# Patient Record
Sex: Male | Born: 2000 | Race: Black or African American | Hispanic: No | Marital: Single | Smoking: Never smoker
Health system: Southern US, Community
[De-identification: ages and names within clinical notes are randomized; demographics above are authoritative.]

## PROBLEM LIST (undated history)

## (undated) DIAGNOSIS — Z789 Other specified health status: Secondary | ICD-10-CM

## (undated) DIAGNOSIS — F419 Anxiety disorder, unspecified: Secondary | ICD-10-CM

---
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
## 2020-04-22 ENCOUNTER — Ambulatory Visit: Payer: Self-pay | Admitting: Nurse Practitioner

## 2020-12-11 ENCOUNTER — Emergency Department (HOSPITAL_COMMUNITY): Payer: Managed Care, Other (non HMO)

## 2020-12-11 ENCOUNTER — Emergency Department (HOSPITAL_COMMUNITY)
Admission: EM | Admit: 2020-12-11 | Discharge: 2020-12-11 | Disposition: A | Discharge status: Home / Self Care | Payer: Managed Care, Other (non HMO) | Attending: Emergency Medicine | Admitting: Emergency Medicine

## 2020-12-11 ENCOUNTER — Other Ambulatory Visit: Payer: Self-pay

## 2020-12-11 ENCOUNTER — Encounter (HOSPITAL_COMMUNITY): Payer: Self-pay | Admitting: Emergency Medicine

## 2020-12-11 DIAGNOSIS — R609 Edema, unspecified: Secondary | ICD-10-CM

## 2020-12-11 DIAGNOSIS — S93491A Sprain of other ligament of right ankle, initial encounter: Secondary | ICD-10-CM

## 2020-12-11 DIAGNOSIS — X58XXXA Exposure to other specified factors, initial encounter: Secondary | ICD-10-CM | POA: Insufficient documentation

## 2020-12-11 DIAGNOSIS — S93431A Sprain of tibiofibular ligament of right ankle, initial encounter: Secondary | ICD-10-CM | POA: Diagnosis not present

## 2020-12-11 DIAGNOSIS — Y9367 Activity, basketball: Secondary | ICD-10-CM | POA: Insufficient documentation

## 2020-12-11 DIAGNOSIS — S99911A Unspecified injury of right ankle, initial encounter: Secondary | ICD-10-CM | POA: Diagnosis present

## 2020-12-11 MED ORDER — IBUPROFEN 600 MG PO TABS: 600.0000 mg | ORAL_TABLET | Freq: Four times a day (QID) | ORAL | 0 refills | Status: DC | PRN

## 2020-12-11 MED ORDER — CYCLOBENZAPRINE HCL 10 MG PO TABS: 10.0000 mg | ORAL_TABLET | Freq: Two times a day (BID) | ORAL | 0 refills | Status: DC | PRN

## 2020-12-11 NOTE — ED Provider Notes (Signed)
[facility]-EMERGENCY DEPT Provider Note   CSN: [id number] Arrival date & time: 12/11/20  1531     History Chief Complaint  Patient presents with  . Ankle Pain    Kenneth Farley is a 20 y.o. male.  The history is provided by the patient. No language interpreter was used.  Ankle Pain Associated symptoms: no fever      20 year old male presents for evaluation of ankle injury.  Patient reports 2 days ago while playing basketball he accidentally stepped on someone else's foot and rolled his right ankle.  Reported cute onset of sharp throbbing pain to the ankle and he has noticed increasing pain and swelling since.  Pain is mild to moderate worse with movement and improved with ice.  Endorsed increasing pain when he tried to walk on it.  He has been able to bear some weight on it.  Does not complain of any knee or hip pain and no foot pain.  He has injured his right ankle in the past.  No report of any numbness.  History reviewed. No pertinent past medical history.  There are no problems to display for this patient.   History reviewed. No pertinent surgical history.     No family history on file.  Social History   Tobacco Use  . Smoking status: Never Smoker  Substance Use Topics  . Alcohol use: No  . Drug use: No    Home Medications Prior to Admission medications   Not on File    Allergies    Patient has no known allergies.  Review of Systems   Review of Systems  Constitutional: Negative for fever.  Musculoskeletal: Positive for arthralgias and joint swelling.  Skin: Negative for wound.  Neurological: Negative for numbness.    Physical Exam Updated Vital Signs BP (!) 147/79 (BP Location: Right Arm)   Pulse 60   Temp 98.8 F (37.1 C) (Oral)   Resp 18   Ht 6\' 2"  (1.88 m)   Wt 72.6 kg   SpO2 98%   BMI 20.54 kg/m   Physical Exam Vitals and nursing note reviewed.  Constitutional:      General: He is not in acute distress.     Appearance: He is well-developed and well-nourished.  HENT:     Head: Atraumatic.  Eyes:     Conjunctiva/sclera: Conjunctivae normal.  Musculoskeletal:        General: Tenderness (Right ankle: Moderate tenderness to lateral malleolus and medial malleoli region with associated edema but no obvious crepitus or obvious deformity noted.  Decreased range of motion secondary to pain.  Intact DP pulse with brisk cap refill.) present.     Cervical back: Neck supple.     Comments: Right leg: No tenderness to right knee.  No tenderness to fifth metatarsal region.  Skin:    Findings: No rash.  Neurological:     Mental Status: He is alert.  Psychiatric:        Mood and Affect: Mood and affect normal.     ED Results / Procedures / Treatments   Labs (all labs ordered are listed, but only abnormal results are displayed) Labs Reviewed - No data to display  EKG None  Radiology DG Ankle Complete Right  Result Date: 12/11/2020 CLINICAL DATA:  Injury. Twisted right ankle 2 days ago while playing basketball EXAM: RIGHT ANKLE - COMPLETE 3+ VIEW COMPARISON:  05/07/2005 FINDINGS: There is a diffuse soft tissue swelling. No underlying fracture or dislocation. No significant arthropathy identified.  IMPRESSION: 1. Soft tissue swelling. 2. No acute bone abnormality. Electronically Signed   By: Signa Kell M.D.   On: 12/11/2020 16:44    Procedures Procedures   Medications Ordered in ED Medications - No data to display  ED Course  I have reviewed the triage vital signs and the nursing notes.  Pertinent labs & imaging results that were available during my care of the patient were reviewed by me and considered in my medical decision making (see chart for details).    MDM Rules/Calculators/A&P                          BP (!) 147/79 (BP Location: Right Arm)   Pulse 60   Temp 98.8 F (37.1 C) (Oral)   Resp 18   Ht 6\' 2"  (1.88 m)   Wt 72.6 kg   SpO2 98%   BMI 20.54 kg/m   Final Clinical  Impression(s) / ED Diagnoses Final diagnoses:  Swelling  Sprain of anterior talofibular ligament of right ankle, initial encounter    Rx / DC Orders ED Discharge Orders    None     4:22 PM Patient twisted his right ankle while playing basketball several days prior.  He does have moderate edema noted to his right ankle most significant to the lateral malleolus region.  X-rays ordered.  4:49 PM X-ray of the right ankle demonstrate soft tissue swelling without any acute bone abnormalities.  Finding consistent with an ankle sprain.  Will place on an ASO brace for support.  Rice therapy discussed.  Stable for discharge.  Orthopedic referral given as needed.   , PA-C 12/11/20 1658    [place], 02/08/21, DO 12/11/20 [phone]

## 2020-12-11 NOTE — ED Notes (Signed)
Pt left before given d/c instructions

## 2020-12-11 NOTE — ED Triage Notes (Signed)
Patient injured his ankle on Monday playing basketball. R ankle is sollen, pain w/ ambulation.

## 2020-12-11 NOTE — Progress Notes (Signed)
Orthopedic Tech Progress Note Patient Details:  Kenneth Farley 02-Feb-2001 [id number]  Ortho Devices Type of Ortho Device: Ankle splint Ortho Device/Splint Location: right Ortho Device/Splint Interventions: Application   Post Interventions Patient Tolerated: Well Instructions Provided: Care of device   [name] 12/11/2020, 4:58 PM

## 2021-03-07 ENCOUNTER — Other Ambulatory Visit: Payer: Self-pay

## 2021-03-07 ENCOUNTER — Ambulatory Visit (HOSPITAL_COMMUNITY)
Admission: EM | Admit: 2021-03-07 | Discharge: 2021-03-07 | Disposition: A | Discharge status: Home / Self Care | Payer: Managed Care, Other (non HMO) | Attending: Internal Medicine | Admitting: Internal Medicine

## 2021-03-07 ENCOUNTER — Emergency Department (HOSPITAL_COMMUNITY)
Admission: EM | Admit: 2021-03-07 | Discharge: 2021-03-07 | Disposition: A | Discharge status: Home / Self Care | Payer: Self-pay | Attending: Emergency Medicine | Admitting: Emergency Medicine

## 2021-03-07 ENCOUNTER — Emergency Department (HOSPITAL_COMMUNITY): Payer: Self-pay

## 2021-03-07 ENCOUNTER — Encounter (HOSPITAL_COMMUNITY): Payer: Self-pay

## 2021-03-07 DIAGNOSIS — N200 Calculus of kidney: Secondary | ICD-10-CM | POA: Insufficient documentation

## 2021-03-07 LAB — URINALYSIS, ROUTINE W REFLEX MICROSCOPIC
Bacteria, UA: NONE SEEN
Bilirubin Urine: NEGATIVE
Glucose, UA: NEGATIVE mg/dL
Ketones, ur: 5 mg/dL — AB
Leukocytes,Ua: NEGATIVE
Nitrite: NEGATIVE
Protein, ur: 30 mg/dL — AB
RBC / HPF: >50 RBC/hpf — ABNORMAL HIGH (ref 0–5)
Specific Gravity, Urine: 1.016 (ref 1.005–1.030)
pH: 6 (ref 5.0–8.0)

## 2021-03-07 LAB — CBC WITH DIFFERENTIAL/PLATELET
Abs Immature Granulocytes: 0.02 10*3/uL (ref 0.00–0.07)
Basophils Absolute: 0 10*3/uL (ref 0.0–0.1)
Basophils Relative: 0 %
Eosinophils Absolute: 0 10*3/uL (ref 0.0–0.5)
Eosinophils Relative: 0 %
HCT: 49.5 % (ref 39.0–52.0)
Hemoglobin: 16.6 g/dL (ref 13.0–17.0)
Immature Granulocytes: 0 %
Lymphocytes Relative: 8 %
Lymphs Abs: 0.9 10*3/uL (ref 0.7–4.0)
MCH: 29 pg (ref 26.0–34.0)
MCHC: 33.5 g/dL (ref 30.0–36.0)
MCV: 86.5 fL (ref 80.0–100.0)
Monocytes Absolute: 0.6 10*3/uL (ref 0.1–1.0)
Monocytes Relative: 6 %
Neutro Abs: 8.9 10*3/uL — ABNORMAL HIGH (ref 1.7–7.7)
Neutrophils Relative %: 86 %
Platelets: 232 10*3/uL (ref 150–400)
RBC: 5.72 MIL/uL (ref 4.22–5.81)
RDW: 12.2 % (ref 11.5–15.5)
WBC: 10.4 10*3/uL (ref 4.0–10.5)
nRBC: 0 % (ref 0.0–0.2)

## 2021-03-07 LAB — COMPREHENSIVE METABOLIC PANEL
ALT: 25 U/L (ref 0–44)
AST: 30 U/L (ref 15–41)
Albumin: 4.6 g/dL (ref 3.5–5.0)
Alkaline Phosphatase: 103 U/L (ref 38–126)
Anion gap: 10 (ref 5–15)
BUN: 14 mg/dL (ref 6–20)
CO2: 27 mmol/L (ref 22–32)
Calcium: 9.3 mg/dL (ref 8.9–10.3)
Chloride: 101 mmol/L (ref 98–111)
Creatinine, Ser: 1.19 mg/dL (ref 0.61–1.24)
GFR, Estimated: >60 mL/min (ref >60)
Glucose, Bld: 109 mg/dL — ABNORMAL HIGH (ref 70–99)
Potassium: 4.1 mmol/L (ref 3.5–5.1)
Sodium: 138 mmol/L (ref 135–145)
Total Bilirubin: 0.6 mg/dL (ref 0.3–1.2)
Total Protein: 7.9 g/dL (ref 6.5–8.1)

## 2021-03-07 LAB — LIPASE, BLOOD: Lipase: 28 U/L (ref 11–51)

## 2021-03-07 MED ORDER — KETOROLAC TROMETHAMINE 30 MG/ML IJ SOLN
30.0000 mg | Freq: Once | INTRAMUSCULAR | Status: AC
  Administered 2021-03-07: 30 mg via INTRAVENOUS
  Filled 2021-03-07: qty 1

## 2021-03-07 MED ORDER — IOHEXOL 300 MG/ML  SOLN
100.0000 mL | Freq: Once | INTRAMUSCULAR | Status: AC | PRN
  Administered 2021-03-07: 100 mL via INTRAVENOUS

## 2021-03-07 MED ORDER — MORPHINE SULFATE (PF) 4 MG/ML IV SOLN
4.0000 mg | Freq: Once | INTRAVENOUS | Status: AC
Start: 2021-03-07 — End: 2021-03-07
  Administered 2021-03-07: 4 mg via INTRAVENOUS
  Filled 2021-03-07: qty 1

## 2021-03-07 MED ORDER — KETOROLAC TROMETHAMINE 10 MG PO TABS: 10.0000 mg | ORAL_TABLET | Freq: Three times a day (TID) | ORAL | 0 refills | Status: DC | PRN

## 2021-03-07 MED ORDER — ONDANSETRON HCL 4 MG/2ML IJ SOLN
4.0000 mg | Freq: Once | INTRAMUSCULAR | Status: AC
  Administered 2021-03-07: 4 mg via INTRAVENOUS
  Filled 2021-03-07: qty 2

## 2021-03-07 NOTE — ED Triage Notes (Signed)
Pt c/o abdominal pain that started this am. Pt denies nausea, vomiting, diarrhea, urinary difficulties, or discharge. Pt states he has constipation, last BM yesterday. Pt seen at [facility] and sent here for eval.

## 2021-03-07 NOTE — ED Notes (Signed)
Patient is being discharged from the Urgent Care and sent to the Emergency Department via POV . Per Lamphtey, MD, patient is in need of higher level of care due to abdominal pain. Patient is aware and verbalizes understanding of plan of care.  Vitals:   03/07/21 1419  BP: 135/87  Pulse: 76  Resp: 16  Temp: (!) 97.4 F (36.3 C)  SpO2: 100%

## 2021-03-07 NOTE — ED Provider Notes (Signed)
[facility] EMERGENCY DEPARTMENT Provider Note   CSN: [id number] Arrival date & time: 03/07/21  1430     History Chief Complaint  Patient presents with  . Abdominal Pain    Kenneth Farley is a 20 y.o. male.  Patient presents with mid abdominal pain.  Describes as persistent since this morning.  Describes it as sharp and nonradiating otherwise.  Denies fevers or cough.  No vomiting or diarrhea no prior similar pain.          History reviewed. No pertinent past medical history.  There are no problems to display for this patient.   History reviewed. No pertinent surgical history.     History reviewed. No pertinent family history.  Social History   Tobacco Use  . Smoking status: Never Smoker  . Smokeless tobacco: Never Used  Vaping Use  . Vaping Use: Never used  Substance Use Topics  . Alcohol use: No  . Drug use: No    Home Medications Prior to Admission medications   Medication Sig Start Date End Date Taking? Authorizing Provider  ketorolac (TORADOL) 10 MG tablet Take 1 tablet (10 mg total) by mouth every 8 (eight) hours as needed for up to 10 doses. 03/07/21  Yes Rilan Eiland, Eustace Moore, MD  cyclobenzaprine (FLEXERIL) 10 MG tablet Take 1 tablet (10 mg total) by mouth 2 (two) times daily as needed for muscle spasms. Patient not taking: Reported on 03/07/2021 12/11/20   Fayrene Helper, PA-C    Allergies    Patient has no known allergies.  Review of Systems   Review of Systems  Constitutional: Negative for fever.  HENT: Negative for ear pain and sore throat.   Eyes: Negative for pain.  Respiratory: Negative for cough.   Cardiovascular: Negative for chest pain.  Gastrointestinal: Positive for abdominal pain.  Genitourinary: Negative for flank pain.  Musculoskeletal: Negative for back pain.  Skin: Negative for color change and rash.  Neurological: Negative for syncope.  All other systems reviewed and are negative.   Physical Exam Updated Vital  Signs BP 137/88 (BP Location: Left Arm)   Pulse 72   Temp 98.7 F (37.1 C) (Oral)   Resp 15   SpO2 99%   Physical Exam Constitutional:      General: He is not in acute distress.    Appearance: He is well-developed.  HENT:     Head: Normocephalic.     Nose: Nose normal.  Eyes:     Extraocular Movements: Extraocular movements intact.  Cardiovascular:     Rate and Rhythm: Normal rate.  Pulmonary:     Effort: Pulmonary effort is normal.  Skin:    Coloration: Skin is not jaundiced.  Neurological:     Mental Status: He is alert. Mental status is at baseline.     ED Results / Procedures / Treatments   Labs (all labs ordered are listed, but only abnormal results are displayed) Labs Reviewed  COMPREHENSIVE METABOLIC PANEL - Abnormal; Notable for the following components:      Result Value   Glucose, Bld 109 (*)    All other components within normal limits  CBC WITH DIFFERENTIAL/PLATELET - Abnormal; Notable for the following components:   Neutro Abs 8.9 (*)    All other components within normal limits  URINALYSIS, ROUTINE W REFLEX MICROSCOPIC - Abnormal; Notable for the following components:   Hgb urine dipstick LARGE (*)    Ketones, ur 5 (*)    Protein, ur 30 (*)    RBC /  HPF >50 (*)    All other components within normal limits  LIPASE, BLOOD    EKG None  Radiology CT Abdomen Pelvis W Contrast  Result Date: 03/07/2021 CLINICAL DATA:  Nonlocalized abdominal pain. EXAM: CT ABDOMEN AND PELVIS WITH CONTRAST TECHNIQUE: Multidetector CT imaging of the abdomen and pelvis was performed using the standard protocol following bolus administration of intravenous contrast. CONTRAST:  OMNIPAQUE IOHEXOL 300 MG/ML  SOLN COMPARISON:  None. FINDINGS: Lower chest: No acute abnormality. Hepatobiliary: No focal liver abnormality. No gallstones, gallbladder wall thickening, or pericholecystic fluid. No biliary dilatation. Pancreas: No focal lesion. Normal pancreatic contour. No  surrounding inflammatory changes. No main pancreatic ductal dilatation. Spleen: Normal in size without focal abnormality. Adrenals/Urinary Tract: No adrenal nodule bilaterally. Delayed right nephrogram. Prominence of the right collecting system with no frank hydronephrosis. Question 2 mm stone in the region of the right ureterovesicular junction (3:71). No left hydronephrosis. No hydroureter. The urinary bladder is unremarkable. Stomach/Bowel: Stomach is within normal limits. No evidence of bowel wall thickening or dilatation. Question internal hernia involving the proximal small bowel (6:32-43) with no associated bowel obstruction. Appendix appears normal. Vascular/Lymphatic: No significant vascular findings are present. No enlarged abdominal or pelvic lymph nodes. Reproductive: Prostate is unremarkable. Other: No intraperitoneal free fluid. No intraperitoneal free gas. No organized fluid collection. Musculoskeletal: No abdominal wall hernia or abnormality. No suspicious lytic or blastic osseous lesions. No acute displaced fracture. Multilevel degenerative changes of the spine. IMPRESSION: 1. Delayed right nephrogram with prominence of the right collecting system with no frank hydronephrosis. Associated 2 mm stone in the region of the right ureterovesicular junction. 2. Normal appendix. Electronically Signed   By: Tish Frederickson M.D.   On: 03/07/2021 18:53    Procedures Procedures   Medications Ordered in ED Medications  ketorolac (TORADOL) 30 MG/ML injection 30 mg (has no administration in time range)  morphine 4 MG/ML injection 4 mg (4 mg Intravenous Given 03/07/21 1747)  ondansetron (ZOFRAN) injection 4 mg (4 mg Intravenous Given 03/07/21 1747)  iohexol (OMNIPAQUE) 300 MG/ML solution 100 mL (100 mLs Intravenous Contrast Given 03/07/21 1818)    ED Course  I have reviewed the triage vital signs and the nursing notes.  Pertinent labs & imaging results that were available during my care of the patient  were reviewed by me and considered in my medical decision making (see chart for details).    MDM Rules/Calculators/A&P                          Abdominal exam is benign no significant tenderness or guarding noted.  Labs unremarkable, CT abdomen pelvis shows right-sided kidney stone approximately 2 mm in size.  On repeat evaluation patient appears improved.  Recommending follow-up with outpatient urology.  Given instructions to follow-up with urology within the week, advised immediate return for worsening pain fevers or any additional concerns.  Final Clinical Impression(s) / ED Diagnoses Final diagnoses:  Kidney stone    Rx / DC Orders ED Discharge Orders         Ordered    ketorolac (TORADOL) 10 MG tablet  Every 8 hours PRN        03/07/21 2000           Cheryll Cockayne, MD 03/07/21 2000

## 2021-03-07 NOTE — ED Triage Notes (Signed)
Emergency Medicine Provider Triage Evaluation Note  Kenneth Farley , a 20 y.o. male  was evaluated in triage.  Pt complains of abd pain that started this am. Pain is diffuse. Denies nvd, urinary sxs.  Review of Systems  Positive: abd pain Negative: Nvd, urinary sxs  Physical Exam  BP 131/90 (BP Location: Right Arm)   Pulse (!) 58   Temp 97.6 F (36.4 C) (Oral)   Resp 18   SpO2 99%  Gen:   Awake, no distress   HEENT:  Atraumatic  Resp:  Normal effort  Cardiac:  Normal rate  Abd:   Nondistended, nontender  MSK:   Moves extremities without difficulty  Neuro:  Speech clear   Medical Decision Making  Medically screening exam initiated at 3:11 PM.  Appropriate orders placed.  Kenneth Farley was informed that the remainder of the evaluation will be completed by another provider, this initial triage assessment does not replace that evaluation, and the importance of remaining in the ED until their evaluation is complete.  Clinical Impression   MSE was initiated and I personally evaluated the patient and placed orders (if any) at  3:12 PM on March 07, 2021.  The patient appears stable so that the remainder of the MSE may be completed by another provider.    Karrie Meres, PA-C 03/07/21 1512

## 2021-03-07 NOTE — ED Triage Notes (Signed)
Patient presents to Urgent Care with complaints of mid abdominal pain since this morning. Pt states pain starts mid abdominal region and spreads to lower back. Pt states he is concerned that it may be constipation related. No changes in diet or meds.   Denies fever, n/v, or diarrhea.

## 2021-03-07 NOTE — Discharge Instructions (Signed)
Call your primary care doctor or specialist as discussed in the next 2-3 days.   Return immediately back to the ER if:  Your symptoms worsen within the next 12-24 hours. You develop new symptoms such as new fevers, persistent vomiting, new pain, shortness of breath, or new weakness or numbness, or if you have any other concerns.  

## 2021-05-23 ENCOUNTER — Emergency Department (HOSPITAL_COMMUNITY)
Admission: EM | Admit: 2021-05-23 | Discharge: 2021-05-23 | Discharge status: Against Medical Advice | Payer: Medicaid Other

## 2021-05-23 NOTE — ED Notes (Signed)
Attempted to call for triage with no respond. 

## 2022-06-07 ENCOUNTER — Other Ambulatory Visit: Payer: Self-pay

## 2022-06-07 ENCOUNTER — Encounter (HOSPITAL_COMMUNITY): Admission: EM | Disposition: A | Discharge status: Home / Self Care | Payer: Self-pay | Source: Home / Self Care

## 2022-06-07 ENCOUNTER — Emergency Department (HOSPITAL_COMMUNITY): Payer: 59 | Admitting: Certified Registered Nurse Anesthetist

## 2022-06-07 ENCOUNTER — Emergency Department (EMERGENCY_DEPARTMENT_HOSPITAL): Payer: 59 | Admitting: Certified Registered Nurse Anesthetist

## 2022-06-07 ENCOUNTER — Inpatient Hospital Stay (HOSPITAL_COMMUNITY)
Admission: EM | Admit: 2022-06-07 | Discharge: 2022-06-14 | DRG: 341 | Disposition: A | Discharge status: Home / Self Care | Payer: 59 | Attending: General Surgery | Admitting: General Surgery

## 2022-06-07 ENCOUNTER — Emergency Department (HOSPITAL_COMMUNITY): Payer: 59

## 2022-06-07 ENCOUNTER — Encounter (HOSPITAL_COMMUNITY): Payer: Self-pay | Admitting: Emergency Medicine

## 2022-06-07 DIAGNOSIS — K559 Vascular disorder of intestine, unspecified: Secondary | ICD-10-CM | POA: Diagnosis present

## 2022-06-07 DIAGNOSIS — Z87442 Personal history of urinary calculi: Secondary | ICD-10-CM

## 2022-06-07 DIAGNOSIS — J9601 Acute respiratory failure with hypoxia: Secondary | ICD-10-CM | POA: Diagnosis not present

## 2022-06-07 DIAGNOSIS — R109 Unspecified abdominal pain: Secondary | ICD-10-CM | POA: Diagnosis present

## 2022-06-07 DIAGNOSIS — S31609A Unspecified open wound of abdominal wall, unspecified quadrant with penetration into peritoneal cavity, initial encounter: Secondary | ICD-10-CM | POA: Diagnosis not present

## 2022-06-07 DIAGNOSIS — K460 Unspecified abdominal hernia with obstruction, without gangrene: Secondary | ICD-10-CM | POA: Diagnosis present

## 2022-06-07 DIAGNOSIS — K458 Other specified abdominal hernia without obstruction or gangrene: Secondary | ICD-10-CM | POA: Diagnosis present

## 2022-06-07 DIAGNOSIS — E876 Hypokalemia: Secondary | ICD-10-CM | POA: Diagnosis not present

## 2022-06-07 DIAGNOSIS — K469 Unspecified abdominal hernia without obstruction or gangrene: Secondary | ICD-10-CM

## 2022-06-07 DIAGNOSIS — K660 Peritoneal adhesions (postprocedural) (postinfection): Secondary | ICD-10-CM | POA: Diagnosis present

## 2022-06-07 DIAGNOSIS — Z5331 Laparoscopic surgical procedure converted to open procedure: Secondary | ICD-10-CM

## 2022-06-07 DIAGNOSIS — E162 Hypoglycemia, unspecified: Secondary | ICD-10-CM | POA: Diagnosis not present

## 2022-06-07 DIAGNOSIS — Q438 Other specified congenital malformations of intestine: Secondary | ICD-10-CM

## 2022-06-07 DIAGNOSIS — K56609 Unspecified intestinal obstruction, unspecified as to partial versus complete obstruction: Principal | ICD-10-CM

## 2022-06-07 HISTORY — PX: LAPAROSCOPY: SHX197

## 2022-06-07 HISTORY — PX: APPLICATION OF WOUND VAC: SHX5189

## 2022-06-07 HISTORY — DX: Other specified health status: Z78.9

## 2022-06-07 LAB — COMPREHENSIVE METABOLIC PANEL
ALT: 85 U/L — ABNORMAL HIGH (ref 0–44)
AST: 78 U/L — ABNORMAL HIGH (ref 15–41)
Albumin: 3.8 g/dL (ref 3.5–5.0)
Alkaline Phosphatase: 143 U/L — ABNORMAL HIGH (ref 38–126)
Anion gap: 9 (ref 5–15)
BUN: 10 mg/dL (ref 6–20)
CO2: 24 mmol/L (ref 22–32)
Calcium: 8.3 mg/dL — ABNORMAL LOW (ref 8.9–10.3)
Chloride: 105 mmol/L (ref 98–111)
Creatinine, Ser: 1.03 mg/dL (ref 0.61–1.24)
GFR, Estimated: >60 mL/min (ref >60)
Glucose, Bld: 178 mg/dL — ABNORMAL HIGH (ref 70–99)
Potassium: 3.2 mmol/L — ABNORMAL LOW (ref 3.5–5.1)
Sodium: 138 mmol/L (ref 135–145)
Total Bilirubin: 0.8 mg/dL (ref 0.3–1.2)
Total Protein: 7.3 g/dL (ref 6.5–8.1)

## 2022-06-07 LAB — TYPE AND SCREEN
ABO/RH(D): B POS
Antibody Screen: NEGATIVE

## 2022-06-07 LAB — CBC
HCT: 44.8 % (ref 39.0–52.0)
Hemoglobin: 14.9 g/dL (ref 13.0–17.0)
MCH: 28.8 pg (ref 26.0–34.0)
MCHC: 33.3 g/dL (ref 30.0–36.0)
MCV: 86.5 fL (ref 80.0–100.0)
Platelets: 262 10*3/uL (ref 150–400)
RBC: 5.18 MIL/uL (ref 4.22–5.81)
RDW: 12.3 % (ref 11.5–15.5)
WBC: 13 10*3/uL — ABNORMAL HIGH (ref 4.0–10.5)
nRBC: 0 % (ref 0.0–0.2)

## 2022-06-07 LAB — HEMOGLOBIN AND HEMATOCRIT, BLOOD
HCT: 37.3 % — ABNORMAL LOW (ref 39.0–52.0)
Hemoglobin: 13.1 g/dL (ref 13.0–17.0)

## 2022-06-07 LAB — LIPASE, BLOOD: Lipase: 30 U/L (ref 11–51)

## 2022-06-07 LAB — MRSA NEXT GEN BY PCR, NASAL: MRSA by PCR Next Gen: NOT DETECTED

## 2022-06-07 LAB — GLUCOSE, CAPILLARY
Glucose-Capillary: 181 mg/dL — ABNORMAL HIGH (ref 70–99)
Glucose-Capillary: 147 mg/dL — ABNORMAL HIGH (ref 70–99)

## 2022-06-07 LAB — HIV ANTIBODY (ROUTINE TESTING W REFLEX): HIV Screen 4th Generation wRfx: NONREACTIVE

## 2022-06-07 LAB — ABO/RH: ABO/RH(D): B POS

## 2022-06-07 LAB — LACTIC ACID, PLASMA
Lactic Acid, Venous: 2.2 mmol/L (ref 0.5–1.9)
Lactic Acid, Venous: 3 mmol/L (ref 0.5–1.9)

## 2022-06-07 SURGERY — LAPAROSCOPY, DIAGNOSTIC
Anesthesia: General | Site: Abdomen

## 2022-06-07 MED ORDER — ONDANSETRON HCL 4 MG/2ML IJ SOLN: 4.0000 mg | Freq: Four times a day (QID) | INTRAMUSCULAR | Status: DC | PRN

## 2022-06-07 MED ORDER — MIDAZOLAM HCL 2 MG/2ML IJ SOLN
INTRAMUSCULAR | Status: DC | PRN
  Administered 2022-06-07: 2 mg via INTRAVENOUS

## 2022-06-07 MED ORDER — POLYETHYLENE GLYCOL 3350 17 G PO PACK
17.0000 g | PACK | Freq: Every day | ORAL | Status: DC
  Administered 2022-06-09 – 2022-06-11 (×3): 17 g
  Filled 2022-06-07 (×5): qty 1

## 2022-06-07 MED ORDER — ONDANSETRON HCL 4 MG/5ML PO SOLN: 4.0000 mg | Freq: Four times a day (QID) | ORAL | Status: DC | PRN

## 2022-06-07 MED ORDER — FENTANYL CITRATE (PF) 250 MCG/5ML IJ SOLN
INTRAMUSCULAR | Status: AC
  Filled 2022-06-07: qty 5

## 2022-06-07 MED ORDER — ACETAMINOPHEN 650 MG RE SUPP: 650.0000 mg | Freq: Four times a day (QID) | RECTAL | Status: DC | PRN

## 2022-06-07 MED ORDER — FENTANYL BOLUS VIA INFUSION: 50.0000 ug | INTRAVENOUS | Status: DC | PRN

## 2022-06-07 MED ORDER — PROPOFOL 500 MG/50ML IV EMUL
INTRAVENOUS | Status: DC | PRN
  Administered 2022-06-07: 100 ug/kg/min via INTRAVENOUS

## 2022-06-07 MED ORDER — LACTATED RINGERS IV SOLN: INTRAVENOUS | Status: DC | PRN

## 2022-06-07 MED ORDER — DEXMEDETOMIDINE HCL IN NACL 400 MCG/100ML IV SOLN
0.0000 ug/kg/h | INTRAVENOUS | Status: DC
  Administered 2022-06-08: 1.2 ug/kg/h via INTRAVENOUS
  Administered 2022-06-08: 1 ug/kg/h via INTRAVENOUS
  Administered 2022-06-08: 0.551 ug/kg/h via INTRAVENOUS
  Administered 2022-06-09 (×2): 1 ug/kg/h via INTRAVENOUS
  Filled 2022-06-07 (×4): qty 100

## 2022-06-07 MED ORDER — SODIUM CHLORIDE 0.9 % IV BOLUS (SEPSIS): 1000.0000 mL | Freq: Once | INTRAVENOUS | Status: DC

## 2022-06-07 MED ORDER — ROCURONIUM BROMIDE 10 MG/ML (PF) SYRINGE
PREFILLED_SYRINGE | INTRAVENOUS | Status: AC
  Filled 2022-06-07: qty 30

## 2022-06-07 MED ORDER — MIDAZOLAM HCL 2 MG/2ML IJ SOLN
INTRAMUSCULAR | Status: AC
  Filled 2022-06-07: qty 2

## 2022-06-07 MED ORDER — DOCUSATE SODIUM 50 MG/5ML PO LIQD
100.0000 mg | Freq: Two times a day (BID) | ORAL | Status: DC
Start: 2022-06-07 — End: 2022-06-14
  Administered 2022-06-09 – 2022-06-13 (×5): 100 mg
  Filled 2022-06-07 (×11): qty 10

## 2022-06-07 MED ORDER — ONDANSETRON 4 MG PO TBDP
4.0000 mg | ORAL_TABLET | Freq: Once | ORAL | Status: AC | PRN
  Administered 2022-06-07: 4 mg via ORAL
  Filled 2022-06-07: qty 1

## 2022-06-07 MED ORDER — OXYCODONE-ACETAMINOPHEN 5-325 MG PO TABS
1.0000 | ORAL_TABLET | Freq: Once | ORAL | Status: AC
  Administered 2022-06-07: 1 via ORAL
  Filled 2022-06-07: qty 1

## 2022-06-07 MED ORDER — FENTANYL 2500MCG IN NS 250ML (10MCG/ML) PREMIX INFUSION
50.0000 ug/h | INTRAVENOUS | Status: DC
  Administered 2022-06-07: 50 ug/h via INTRAVENOUS
  Administered 2022-06-07 – 2022-06-08 (×2): 200 ug/h via INTRAVENOUS
  Filled 2022-06-07 (×3): qty 250

## 2022-06-07 MED ORDER — FENTANYL CITRATE PF 50 MCG/ML IJ SOSY
100.0000 ug | PREFILLED_SYRINGE | Freq: Once | INTRAMUSCULAR | Status: AC
  Administered 2022-06-07: 100 ug via INTRAVENOUS
  Filled 2022-06-07: qty 2

## 2022-06-07 MED ORDER — LACTATED RINGERS IV SOLN: INTRAVENOUS | Status: DC

## 2022-06-07 MED ORDER — POLYETHYLENE GLYCOL 3350 17 G PO PACK: 17.0000 g | PACK | Freq: Every day | ORAL | Status: DC

## 2022-06-07 MED ORDER — PROPOFOL 1000 MG/100ML IV EMUL
0.0000 ug/kg/min | INTRAVENOUS | Status: DC
  Administered 2022-06-07 (×2): 50 ug/kg/min via INTRAVENOUS
  Administered 2022-06-07: 40 ug/kg/min via INTRAVENOUS
  Administered 2022-06-08 (×4): 50 ug/kg/min via INTRAVENOUS
  Filled 2022-06-07 (×8): qty 100

## 2022-06-07 MED ORDER — ENOXAPARIN SODIUM 40 MG/0.4ML IJ SOSY: 40.0000 mg | PREFILLED_SYRINGE | INTRAMUSCULAR | Status: DC

## 2022-06-07 MED ORDER — BUPIVACAINE HCL (PF) 0.25 % IJ SOLN
INTRAMUSCULAR | Status: AC
  Filled 2022-06-07: qty 30

## 2022-06-07 MED ORDER — PROPOFOL 10 MG/ML IV BOLUS
INTRAVENOUS | Status: AC
  Filled 2022-06-07: qty 20

## 2022-06-07 MED ORDER — ORAL CARE MOUTH RINSE
15.0000 mL | OROMUCOSAL | Status: DC
  Administered 2022-06-07 – 2022-06-08 (×13): 15 mL via OROMUCOSAL

## 2022-06-07 MED ORDER — ORAL CARE MOUTH RINSE: 15.0000 mL | OROMUCOSAL | Status: DC | PRN

## 2022-06-07 MED ORDER — METOPROLOL TARTRATE 5 MG/5ML IV SOLN: 5.0000 mg | Freq: Four times a day (QID) | INTRAVENOUS | Status: DC | PRN

## 2022-06-07 MED ORDER — ROCURONIUM BROMIDE 10 MG/ML (PF) SYRINGE
PREFILLED_SYRINGE | INTRAVENOUS | Status: DC | PRN
  Administered 2022-06-07: 30 mg via INTRAVENOUS
  Administered 2022-06-07: 50 mg via INTRAVENOUS
  Administered 2022-06-07: 30 mg via INTRAVENOUS
  Administered 2022-06-07: 20 mg via INTRAVENOUS

## 2022-06-07 MED ORDER — DOCUSATE SODIUM 50 MG/5ML PO LIQD: 100.0000 mg | Freq: Two times a day (BID) | ORAL | Status: DC

## 2022-06-07 MED ORDER — CHLORHEXIDINE GLUCONATE 0.12 % MT SOLN
OROMUCOSAL | Status: AC
  Administered 2022-06-07: 15 mL via OROMUCOSAL
  Filled 2022-06-07: qty 15

## 2022-06-07 MED ORDER — EPHEDRINE 5 MG/ML INJ
INTRAVENOUS | Status: AC
  Filled 2022-06-07: qty 5

## 2022-06-07 MED ORDER — ACETAMINOPHEN 325 MG PO TABS
650.0000 mg | ORAL_TABLET | Freq: Four times a day (QID) | ORAL | Status: DC | PRN
  Administered 2022-06-10: 650 mg
  Filled 2022-06-07: qty 2

## 2022-06-07 MED ORDER — PROPOFOL 1000 MG/100ML IV EMUL
INTRAVENOUS | Status: AC
  Filled 2022-06-07: qty 100

## 2022-06-07 MED ORDER — 0.9 % SODIUM CHLORIDE (POUR BTL) OPTIME
TOPICAL | Status: DC | PRN
  Administered 2022-06-07: 3000 mL

## 2022-06-07 MED ORDER — MORPHINE SULFATE (PF) 2 MG/ML IV SOLN
2.0000 mg | INTRAVENOUS | Status: DC | PRN
  Administered 2022-06-09 – 2022-06-13 (×8): 2 mg via INTRAVENOUS
  Filled 2022-06-07 (×8): qty 1

## 2022-06-07 MED ORDER — ORAL CARE MOUTH RINSE: 15.0000 mL | Freq: Once | OROMUCOSAL | Status: AC

## 2022-06-07 MED ORDER — DIPHENHYDRAMINE HCL 12.5 MG/5ML PO ELIX: 12.5000 mg | ORAL_SOLUTION | Freq: Four times a day (QID) | ORAL | Status: DC | PRN

## 2022-06-07 MED ORDER — ACETAMINOPHEN 500 MG PO TABS
1000.0000 mg | ORAL_TABLET | Freq: Once | ORAL | Status: AC
Start: 2022-06-07 — End: 2022-06-07
  Administered 2022-06-07: 1000 mg via ORAL
  Filled 2022-06-07: qty 2

## 2022-06-07 MED ORDER — DEXAMETHASONE SODIUM PHOSPHATE 10 MG/ML IJ SOLN
INTRAMUSCULAR | Status: DC | PRN
  Administered 2022-06-07: 5 mg via INTRAVENOUS

## 2022-06-07 MED ORDER — CHLORHEXIDINE GLUCONATE CLOTH 2 % EX PADS
6.0000 | MEDICATED_PAD | Freq: Every day | CUTANEOUS | Status: DC
  Administered 2022-06-07 – 2022-06-09 (×2): 6 via TOPICAL

## 2022-06-07 MED ORDER — LIDOCAINE 2% (20 MG/ML) 5 ML SYRINGE
INTRAMUSCULAR | Status: DC | PRN
  Administered 2022-06-07: 40 mg via INTRAVENOUS

## 2022-06-07 MED ORDER — MIDAZOLAM HCL 2 MG/2ML IJ SOLN
2.0000 mg | INTRAMUSCULAR | Status: DC | PRN
  Administered 2022-06-07 – 2022-06-08 (×6): 2 mg via INTRAVENOUS
  Filled 2022-06-07 (×5): qty 2

## 2022-06-07 MED ORDER — OXYCODONE HCL 5 MG PO TABS
5.0000 mg | ORAL_TABLET | ORAL | Status: DC | PRN
  Administered 2022-06-10: 5 mg
  Filled 2022-06-07: qty 1

## 2022-06-07 MED ORDER — CHLORHEXIDINE GLUCONATE 0.12 % MT SOLN: 15.0000 mL | Freq: Once | OROMUCOSAL | Status: AC

## 2022-06-07 MED ORDER — ONDANSETRON HCL 4 MG/2ML IJ SOLN
4.0000 mg | Freq: Once | INTRAMUSCULAR | Status: AC
  Administered 2022-06-07: 4 mg via INTRAVENOUS
  Filled 2022-06-07: qty 2

## 2022-06-07 MED ORDER — ONDANSETRON 4 MG PO TBDP: 4.0000 mg | ORAL_TABLET | Freq: Four times a day (QID) | ORAL | Status: DC | PRN

## 2022-06-07 MED ORDER — FENTANYL CITRATE (PF) 250 MCG/5ML IJ SOLN
INTRAMUSCULAR | Status: DC | PRN
  Administered 2022-06-07: 125 ug via INTRAVENOUS
  Administered 2022-06-07: 25 ug via INTRAVENOUS
  Administered 2022-06-07: 100 ug via INTRAVENOUS

## 2022-06-07 MED ORDER — DIPHENHYDRAMINE HCL 50 MG/ML IJ SOLN: 12.5000 mg | Freq: Four times a day (QID) | INTRAMUSCULAR | Status: DC | PRN

## 2022-06-07 MED ORDER — PROPOFOL 10 MG/ML IV BOLUS
INTRAVENOUS | Status: DC | PRN
  Administered 2022-06-07: 200 mg via INTRAVENOUS

## 2022-06-07 MED ORDER — FENTANYL CITRATE PF 50 MCG/ML IJ SOSY: 50.0000 ug | PREFILLED_SYRINGE | Freq: Once | INTRAMUSCULAR | Status: AC

## 2022-06-07 MED ORDER — SUCCINYLCHOLINE CHLORIDE 200 MG/10ML IV SOSY
PREFILLED_SYRINGE | INTRAVENOUS | Status: AC
  Filled 2022-06-07: qty 10

## 2022-06-07 MED ORDER — MIDAZOLAM HCL 2 MG/2ML IJ SOLN
2.0000 mg | INTRAMUSCULAR | Status: DC | PRN
  Administered 2022-06-07 (×2): 2 mg via INTRAVENOUS
  Filled 2022-06-07 (×3): qty 2

## 2022-06-07 MED ORDER — ONDANSETRON HCL 4 MG/2ML IJ SOLN
INTRAMUSCULAR | Status: DC | PRN
  Administered 2022-06-07: 4 mg via INTRAVENOUS

## 2022-06-07 MED ORDER — PANTOPRAZOLE SODIUM 40 MG IV SOLR
40.0000 mg | Freq: Every day | INTRAVENOUS | Status: DC
Start: 2022-06-07 — End: 2022-06-14
  Administered 2022-06-07 – 2022-06-13 (×7): 40 mg via INTRAVENOUS
  Filled 2022-06-07 (×7): qty 10

## 2022-06-07 MED ORDER — SODIUM CHLORIDE 0.9 % IV SOLN
2.0000 g | Freq: Once | INTRAVENOUS | Status: AC
  Administered 2022-06-07: 2 g via INTRAVENOUS
  Filled 2022-06-07 (×2): qty 2

## 2022-06-07 MED ORDER — DEXTROSE-NACL 5-0.45 % IV SOLN: INTRAVENOUS | Status: DC

## 2022-06-07 MED ORDER — SODIUM CHLORIDE 0.9 % IV SOLN: INTRAVENOUS | Status: DC

## 2022-06-07 SURGICAL SUPPLY — 43 items
ADH SKN CLS APL DERMABOND .7 (GAUZE/BANDAGES/DRESSINGS) ×2
APL PRP STRL LF DISP 70% ISPRP (MISCELLANEOUS) ×2
BAG COUNTER SPONGE SURGICOUNT (BAG) ×5 IMPLANT
BAG SPNG CNTER NS LX DISP (BAG) ×6
BLADE CLIPPER SURG (BLADE) ×1 IMPLANT
CANISTER SUCT 3000ML PPV (MISCELLANEOUS) IMPLANT
CHLORAPREP W/TINT 26 (MISCELLANEOUS) ×3 IMPLANT
COVER SURGICAL LIGHT HANDLE (MISCELLANEOUS) ×3 IMPLANT
DERMABOND ADVANCED (GAUZE/BANDAGES/DRESSINGS) ×1
DERMABOND ADVANCED .7 DNX12 (GAUZE/BANDAGES/DRESSINGS) ×2 IMPLANT
DRAPE WARM FLUID 44X44 (DRAPES) ×1 IMPLANT
ELECT REM PT RETURN 9FT ADLT (ELECTROSURGICAL) ×3
ELECTRODE REM PT RTRN 9FT ADLT (ELECTROSURGICAL) ×2 IMPLANT
GLOVE BIO SURGEON STRL SZ7.5 (GLOVE) ×1 IMPLANT
GLOVE BIOGEL PI IND STRL 7.0 (GLOVE) ×2 IMPLANT
GLOVE BIOGEL PI INDICATOR 7.0 (GLOVE) ×2
GLOVE SURG SS PI 7.0 STRL IVOR (GLOVE) ×4 IMPLANT
GOWN STRL REUS W/ TWL LRG LVL3 (GOWN DISPOSABLE) ×6 IMPLANT
GOWN STRL REUS W/TWL LRG LVL3 (GOWN DISPOSABLE) ×9
HANDLE SUCTION POOLE (INSTRUMENTS) IMPLANT
KIT BASIN OR (CUSTOM PROCEDURE TRAY) ×3 IMPLANT
KIT TURNOVER KIT B (KITS) ×3 IMPLANT
NS IRRIG 1000ML POUR BTL (IV SOLUTION) ×5 IMPLANT
PAD ARMBOARD 7.5X6 YLW CONV (MISCELLANEOUS) ×6 IMPLANT
PENCIL SMOKE EVACUATOR (MISCELLANEOUS) ×1 IMPLANT
SET TUBE SMOKE EVAC HIGH FLOW (TUBING) ×3 IMPLANT
SLEEVE ENDOPATH XCEL 5M (ENDOMECHANICALS) ×3 IMPLANT
SPONGE ABDOMINAL VAC ABTHERA (MISCELLANEOUS) ×1 IMPLANT
SPONGE T-LAP 18X18 ~~LOC~~+RFID (SPONGE) ×2 IMPLANT
SUCTION POOLE HANDLE (INSTRUMENTS) ×3
SUT MNCRL AB 4-0 PS2 18 (SUTURE) ×3 IMPLANT
SUT SILK 2 0 TIES 10X30 (SUTURE) ×1 IMPLANT
SUT SILK 2 0SH CR/8 30 (SUTURE) ×1 IMPLANT
SUT SILK 3 0 SH CR/8 (SUTURE) ×1 IMPLANT
SUT SILK 3 0 TIES 10X30 (SUTURE) ×1 IMPLANT
TOWEL GREEN STERILE (TOWEL DISPOSABLE) ×3 IMPLANT
TOWEL GREEN STERILE FF (TOWEL DISPOSABLE) ×4 IMPLANT
TRAY LAPAROSCOPIC MC (CUSTOM PROCEDURE TRAY) ×3 IMPLANT
TROCAR XCEL NON-BLD 11X100MML (ENDOMECHANICALS) IMPLANT
TROCAR Z-THREAD OPTICAL 5X100M (TROCAR) ×3 IMPLANT
TUBE CONNECTING 12X1/4 (SUCTIONS) ×1 IMPLANT
WARMER LAPAROSCOPE (MISCELLANEOUS) ×3 IMPLANT
YANKAUER SUCT BULB TIP NO VENT (SUCTIONS) ×1 IMPLANT

## 2022-06-07 NOTE — Progress Notes (Signed)
Pre Procedure note for inpatients:   Kenneth Farley has been scheduled for Procedure(s): LAPAROSCOPY DIAGNOSTIC, POSSIBLE EX-LAP, POSSIBLE SMALL BOWEL RESECTION (N/A) today. The various methods of treatment have been discussed with the patient. After consideration of the risks, benefits and treatment options the patient has consented to the planned procedure.   The patient has been seen and labs reviewed. There are no changes in the patient's condition to prevent proceeding with the planned procedure today.  Recent labs:  Lab Results  Component Value Date   WBC 13.0 (H) 06/07/2022   HGB 14.9 06/07/2022   HCT 44.8 06/07/2022   PLT 262 06/07/2022   GLUCOSE 178 (H) 06/07/2022   ALT 85 (H) 06/07/2022   AST 78 (H) 06/07/2022   NA 138 06/07/2022   K 3.2 (L) 06/07/2022   CL 105 06/07/2022   CREATININE 1.03 06/07/2022   BUN 10 06/07/2022   CO2 24 06/07/2022    Rodman Pickle, MD 06/07/2022 7:29 AM

## 2022-06-07 NOTE — ED Provider Notes (Signed)
[facility] EMERGENCY DEPARTMENT Provider Note   CSN: [id number] Arrival date & time: 06/07/22  5625     History  Chief Complaint  Patient presents with   Abdominal Pain   Back Pain    Hx. Kidney Stone    Kenneth Farley is a 21 y.o. male.  The history is provided by the patient and a relative.  Abdominal Pain Pain location:  Generalized Pain quality: sharp   Pain severity:  Severe Onset quality:  Sudden Duration:  2 days Timing:  Constant Progression:  Worsening Chronicity:  New Relieved by:  Nothing Worsened by:  Movement and palpation Associated symptoms: nausea and vomiting   Back Pain Associated symptoms: abdominal pain       Patient is otherwise healthy 21 year old male who presents with abdominal pain.  He reports onset over 2 days ago and is worsening.  He thinks it might be a kidney stone.  He reports nausea and vomiting.  He reports he has had a normal bowel movement.  No previous abdominal surgery History is limited as patient is in severe pain Home Medications Prior to Admission medications   Not on File      Allergies    Patient has no known allergies.    Review of Systems   Review of Systems  Gastrointestinal:  Positive for abdominal pain, nausea and vomiting.  Musculoskeletal:  Positive for back pain.    Physical Exam Updated Vital Signs BP 123/79 (BP Location: Right Arm)   Pulse 91   Temp 98.6 F (37 C)   Resp 16   SpO2 99%  Physical Exam CONSTITUTIONAL: Well developed/well nourished, anxious and writhing around in pain HEAD: Normocephalic/atraumatic EYES: EOMI/PERRL ENMT: Mucous membranes moist NECK: supple no meningeal signs SPINE/BACK:entire spine nontender CV: S1/S2 noted LUNGS: Lungs are clear to auscultation bilaterally, no apparent distress ABDOMEN: soft, diffuse moderate tenderness GU:no cva tenderness NEURO: Pt is awake/alert, moves all extremitiesx4.  No facial droop.   EXTREMITIES: full ROM SKIN:  warm, color normal PSYCH: Anxious ED Results / Procedures / Treatments   Labs (all labs ordered are listed, but only abnormal results are displayed) Labs Reviewed  COMPREHENSIVE METABOLIC PANEL - Abnormal; Notable for the following components:      Result Value   Potassium 3.2 (*)    Glucose, Bld 178 (*)    Calcium 8.3 (*)    AST 78 (*)    ALT 85 (*)    Alkaline Phosphatase 143 (*)    All other components within normal limits  CBC - Abnormal; Notable for the following components:   WBC 13.0 (*)    All other components within normal limits  LIPASE, BLOOD    EKG EKG Interpretation  Date/Time:  Sunday June 07 2022 04:38:29 EDT Ventricular Rate:  70 PR Interval:  162 QRS Duration: 104 QT Interval:  450 QTC Calculation: 486 R Axis:   92 Text Interpretation: Normal sinus rhythm Rightward axis Interpretation limited secondary to artifact Abnormal ECG Confirmed by Zadie Rhine ([id number]) on 06/07/2022 5:48:46 AM  Radiology CT Renal Stone Study  Result Date: 06/07/2022 CLINICAL DATA:  Sudden onset/flank pain.  Kidney stone suspected. EXAM: CT ABDOMEN AND PELVIS WITHOUT CONTRAST TECHNIQUE: Multidetector CT imaging of the abdomen and pelvis was performed following the standard protocol without IV contrast. RADIATION DOSE REDUCTION: This exam was performed according to the departmental dose-optimization program which includes automated exposure control, adjustment of the mA and/or kV according to patient size and/or use of iterative reconstruction technique.  COMPARISON:  CT with contrast 03/07/2021. FINDINGS: Lower chest: No acute abnormality. Hepatobiliary: No focal liver abnormality is seen without contrast. The gallbladder and bile ducts are unremarkable. Pancreas: Unremarkable without contrast. Spleen: Unremarkable without contrast. Adrenals/Urinary Tract: There is no adrenal mass. No focal abnormality in the unenhanced renal cortex. No urinary stone or obstruction is seen. Bladder is  contracted and poorly visualized. Stomach/Bowel: There is fluid distention of the stomach. There is fluid distention upper to mid abdominal small bowel with dilatation up to 3.2 cm. This is most likely due to an internal hernia, with twisting of the mesenteric vasculature in the low central abdomen noted. This was seen previously but was not associated with bowel obstruction at that time. There is mesenteric edema and edema in the involved bowel wall. The distal small bowel segments in the pelvis are normal caliber. The appendix is normal caliber. There is no evidence of colitis or diverticulitis. Vascular/Lymphatic: The unenhanced aorta is normal. There is no portal vein dilatation. No adenopathy is seen. Reproductive: Both testicles are in the scrotal sac. There is no prostatomegaly. Other: There is no anterior wall hernia. No bowel pneumatosis is seen at this time. There is scattered interloop fluid in the mesentery in the abdomen, minimal posterior pelvic ascites. There is no free air, free hemorrhage or abscess. Musculoskeletal: No acute or significant osseous findings. IMPRESSION: 1. Small-bowel obstruction with bowel dilatation up to 3.2 cm, etiology believed to be an internal hernia with twisting of the mesenteric vasculature in the low central abdomen. This was seen previously but previously was not associated with a small bowel obstruction. 2. There are thickened folds in the dilated segments concerning for ischemia, although at this time there is no bowel pneumatosis or portal venous gas. There is mesenteric edema and scattered interloop fluid. 3. Urgent surgical consultation recommended. Electronically Signed   By: Almira Bar M.D.   On: 06/07/2022 05:19    Procedures .Critical Care  Performed by: Zadie Rhine, MD Authorized by: Zadie Rhine, MD   Critical care provider statement:    Critical care time (minutes):  58   Critical care start time:  06/07/2022 5:35 AM   Critical care end  time:  06/07/2022 6:33 AM   Critical care time was exclusive of:  Separately billable procedures and treating other patients   Critical care was necessary to treat or prevent imminent or life-threatening deterioration of the following conditions:  Sepsis and dehydration   Critical care was time spent personally by me on the following activities:  Discussions with consultants, examination of patient, obtaining history from patient or surrogate, ordering and review of radiographic studies, ordering and review of laboratory studies, ordering and performing treatments and interventions, pulse oximetry, evaluation of patient's response to treatment, development of treatment plan with patient or surrogate and re-evaluation of patient's condition   I assumed direction of critical care for this patient from another provider in my specialty: no     Care discussed with: admitting provider       Medications Ordered in ED Medications  sodium chloride 0.9 % bolus 1,000 mL (has no administration in time range)  0.9 %  sodium chloride infusion (has no administration in time range)  cefoTEtan (CEFOTAN) 2 g in sodium chloride 0.9 % 100 mL IVPB (has no administration in time range)  ondansetron (ZOFRAN-ODT) disintegrating tablet 4 mg (4 mg Oral Given 06/07/22 0337)  oxyCODONE-acetaminophen (PERCOCET/ROXICET) 5-325 MG per tablet 1 tablet (1 tablet Oral Given 06/07/22 0337)  fentaNYL (SUBLIMAZE)  injection 100 mcg (100 mcg Intravenous Given 06/07/22 0546)  ondansetron (ZOFRAN) injection 4 mg (4 mg Intravenous Given 06/07/22 0546)  fentaNYL (SUBLIMAZE) injection 100 mcg (100 mcg Intravenous Given 06/07/22 0631)    ED Course/ Medical Decision Making/ A&P Clinical Course as of 06/07/22 7062  Wynelle Link Jun 07, 2022  0544 Discussed the case with general surgery Dr. Andrey Campanile.  He will evaluate imaging and call back [DW]  0548 Glucose(!): 178 Hyperglycemia [DW]  0548 WBC(!): 13.0 Leukocytosis [DW]  0607 Discussed with Dr.  Andrey Campanile.  He requests NG tube placement.  Patient will need to go to the OR [DW]    Clinical Course User Index [DW] Zadie Rhine, MD                           Medical Decision Making Amount and/or Complexity of Data Reviewed Labs: ordered. Decision-making details documented in ED Course. Radiology: ordered.  Risk Prescription drug management. Decision regarding hospitalization.   This patient presents to the ED for concern of abdominal pain, this involves an extensive number of treatment options, and is a complaint that carries with it a high risk of complications and morbidity.  The differential diagnosis includes but is not limited to cholecystitis, cholelithiasis, pancreatitis, gastritis, peptic ulcer disease, appendicitis, bowel obstruction, bowel perforation, diverticulitis, AAA, ischemic bowel     Social Determinants of Health: Patient's impaired access to primary care  increases the complexity of managing their presentation  Additional history obtained: Additional history obtained from family  Lab Tests: I Ordered, and personally interpreted labs.  The pertinent results include: Leukocytosis  Imaging Studies ordered: I ordered imaging studies including CT scan abdomen pelvis   I independently visualized and interpreted imaging which showed small bowel obstruction I agree with the radiologist interpretation  Cardiac Monitoring: The patient was maintained on a cardiac monitor.  I personally viewed and interpreted the cardiac monitor which showed an underlying rhythm of:  sinus rhythm  Medicines ordered and prescription drug management: I ordered medication including fentanyl for pain Reevaluation of the patient after these medicines showed that the patient    improved   Critical Interventions:  admission for surgical correction  Consultations Obtained: I requested consultation with the consultant General surgery Dr. Andrey Campanile , and discussed  findings as well as  pertinent plan - they recommend: admit, operative management  Reevaluation: After the interventions noted above, I reevaluated the patient and found that they have :improved  Complexity of problems addressed: Patient's presentation is most consistent with  acute presentation with potential threat to life or bodily function  Disposition: After consideration of the diagnostic results and the patient's response to treatment,  I feel that the patent would benefit from admission   .   Patient with abrupt onset of abdominal pain over the past 2 days with vomiting.  He is very ill-appearing.  He will be taken to the OR with general surgery.  Patient and family have been updated on plan        Final Clinical Impression(s) / ED Diagnoses Final diagnoses:  Small bowel obstruction [facility])    Rx / DC Orders ED Discharge Orders     None         Zadie Rhine, MD 06/07/22 [phone]

## 2022-06-07 NOTE — Op Note (Signed)
Preoperative diagnosis: internal hernia  Postoperative diagnosis: same   Procedure: diagnostic laparoscopy, lysis of adhesions, reduction of internal hernia, placement of abdominal temporary closure device  Surgeon: Feliciana Rossetti, M.D.  Asst: P.J. Carolynne Edouard, M.D.  Anesthesia: general  Indications for procedure: Kenneth Farley is a 21 y.o. year old male with symptoms of abdominal pain and nausea. On work up he had concerns for internal hernia with intestinal ischemia.  Description of procedure: The patient was brought into the operative suite. Anesthesia was administered with General endotracheal anesthesia. WHO checklist was applied. The patient was then placed in supine position. The area was prepped and draped in the usual sterile fashion.  A left subcostal incision was made. A 68mm trocar was used to gain access to the peritoneal cavity by optical entry technique. Pneumoperitoneum was applied with a high flow and low pressure. The laparoscope was reinserted to confirm position. Upon entry into the abdomen there was a large central bulge concerning for congential anomaly of intestine or extremely dilated intestine, in the lower abdomen ischemic small intestine was visualized. There was not a large space to work in due to dilation of intestine so decision was made to open.  A midline incision was made and fascia opened in the midline with cautery. A moderate amount of yellow/gray fluid was evacuated. The small intestine was inspected, there was a sac holding a portion of the small intestine and the ischemic transition appeared to be at the opening of the sac which appeared to be under the terminal ileum. The sac was opened and the intestine inspected. The intestine was reduced from under the sac. Initially, I thought it was a malrotation but after further inspection there was a very redundant sigmoid colon and the transverse colon did cross the abdomen to a right sided right colon. The small intestine  was placed in a neurtral position and ligament of trietz identified. NG was identified in good position. There were about 120 cm of intestine that had been twisted   Findings: ***  Specimen: ***  Implant: ***   Blood loss: ***  Local anesthesia: {Blank multiple:19196::"*** ml marcaine ","*** ml Exparel:Marcaine Mix"}  Complications: ***  Feliciana Rossetti, M.D. General, Bariatric, & Minimally Invasive Surgery [facility]

## 2022-06-07 NOTE — Anesthesia Preprocedure Evaluation (Addendum)
Anesthesia Evaluation  Patient identified by MRN, date of birth, ID band Patient awake    Reviewed: Allergy & Precautions, NPO status , Patient's Chart, lab work & pertinent test results  History of Anesthesia Complications Negative for: history of anesthetic complications  Airway Mallampati: I  TM Distance: >3 FB Neck ROM: Full    Dental  (+) Dental Advisory Given, Teeth Intact   Pulmonary neg pulmonary ROS,    breath sounds clear to auscultation       Cardiovascular negative cardio ROS   Rhythm:Regular Rate:Normal     Neuro/Psych negative neurological ROS     GI/Hepatic Elevated LFTs Bowel obstruction    Endo/Other  negative endocrine ROS  Renal/GU negative Renal ROS     Musculoskeletal   Abdominal   Peds  Hematology negative hematology ROS (+)   Anesthesia Other Findings   Reproductive/Obstetrics                            Anesthesia Physical Anesthesia Plan  ASA: 2 and emergent  Anesthesia Plan: General   Post-op Pain Management:    Induction: Intravenous and Rapid sequence  PONV Risk Score and Plan: 2 and Ondansetron and Dexamethasone  Airway Management Planned: Oral ETT  Additional Equipment: None  Intra-op Plan:   Post-operative Plan: Extubation in OR  Informed Consent: I have reviewed the patients History and Physical, chart, labs and discussed the procedure including the risks, benefits and alternatives for the proposed anesthesia with the patient or authorized representative who has indicated his/her understanding and acceptance.     Dental advisory given  Plan Discussed with: CRNA and Surgeon  Anesthesia Plan Comments:        Anesthesia Quick Evaluation

## 2022-06-07 NOTE — ED Triage Notes (Signed)
Patient reports sudden onset pain across his abdomen radiating to lower back this morning  with emesis , history of kidney stone.

## 2022-06-07 NOTE — Transfer of Care (Signed)
Immediate Anesthesia Transfer of Care Note  Patient: Kenneth Farley  Procedure(s) Performed: EXPLORATORY LAPAROTOMY (Abdomen) APPLICATION OF WOUND VAC (Abdomen)  Patient Location: 2M3  Anesthesia Type:General  Level of Consciousness: Patient remains intubated per anesthesia plan  Airway & Oxygen Therapy: Patient remains intubated per anesthesia plan and Patient placed on Ventilator (see vital sign flow sheet for setting)  Post-op Assessment: Report given to RN and Post -op Vital signs reviewed and stable  Post vital signs: Reviewed and stable  Last Vitals:  Vitals Value Taken Time  BP    Temp    Pulse    Resp    SpO2 100     Last Pain:  Vitals:   06/07/22 0726  PainSc: 10-Worst pain ever      Patients Stated Pain Goal: 3 (06/07/22 0726)  Complications: No notable events documented.

## 2022-06-07 NOTE — H&P (Signed)
CC: abdominal pain  Requesting provider: dr Bebe Shaggy  HPI: Kenneth Farley is an 21 y.o. male who is here for worsening abdominal pain.  He states his abdominal pain started 2 days ago on Friday and it is gotten progressively worse.  He had nausea and vomiting but no fever or chills.  Has had a bowel movement.  No prior abdominal surgery.  Had a similar episode about a year ago but not nearly as intense.  No trouble urinating.  No blood in urine.  Denies any chronic medical conditions.  History is somewhat challenging to obtain due to patient writhing in abdominal pain.  Patient has a remote history of kidney stones so a noncontrasted CT was performed.  No stone was identified but the patient had evidence of an internal hernia causing bowel obstruction.  History reviewed. No pertinent past medical history.  History reviewed. No pertinent surgical history.  No family history on file.  Social:  reports that he has never smoked. He has never used smokeless tobacco. He reports that he does not drink alcohol and does not use drugs.  He states that he does drink some alcohol but denies drug use  Allergies: No Known Allergies  Medications: I have reviewed the patient's current medications.   ROS -unable to obtain a full 12 point review of systems due to patient writhing in abdominal pain  PE Blood pressure (!) 161/95, pulse (!) 55, temperature 98.6 F (37 C), resp. rate (!) 26, SpO2 98 %. Constitutional: appears very uncomfortable, writhing in bed with severe abd pain; conversant; no deformities Eyes: Moist conjunctiva; no lid lag; anicteric; PERRL Neck: Trachea midline; no thyromegaly Lungs: Normal respiratory effort; no tactile fremitus CV: RRR; no palpable thrills; no pitting edema GI: Abd a little firm, nondistended, TTP throughout, early peritonitis, +voluntary guarding; no palpable hepatosplenomegaly MSK: no clubbing/cyanosis; no obvious deformity Psychiatric: Appropriate affect;  alert and oriented x3 Lymphatic: No palpable cervical or axillary lymphadenopathy Skin:no rash/lesions/jaundice  Results for orders placed or performed during the hospital encounter of 06/07/22 (from the past 48 hour(s))  Lipase, blood     Status: None   Collection Time: 06/07/22  3:45 AM  Result Value Ref Range   Lipase 30 11 - 51 U/L    Comment: Performed at [facility], [address], [place], [place] [address]  Comprehensive metabolic panel     Status: Abnormal   Collection Time: 06/07/22  3:45 AM  Result Value Ref Range   Sodium 138 135 - 145 mmol/L   Potassium 3.2 (L) 3.5 - 5.1 mmol/L   Chloride 105 98 - 111 mmol/L   CO2 24 22 - 32 mmol/L   Glucose, Bld 178 (H) 70 - 99 mg/dL    Comment: Glucose reference range applies only to samples taken after fasting for at least 8 hours.   BUN 10 6 - 20 mg/dL   Creatinine, Ser 1.91 0.61 - 1.24 mg/dL   Calcium 8.3 (L) 8.9 - 10.3 mg/dL   Total Protein 7.3 6.5 - 8.1 g/dL   Albumin 3.8 3.5 - 5.0 g/dL   AST 78 (H) 15 - 41 U/L   ALT 85 (H) 0 - 44 U/L   Alkaline Phosphatase 143 (H) 38 - 126 U/L   Total Bilirubin 0.8 0.3 - 1.2 mg/dL   GFR, Estimated >47 >82 mL/min    Comment: (NOTE) Calculated using the CKD-EPI Creatinine Equation (2021)    Anion gap 9 5 - 15    Comment: Performed at  [facility], [address] [place], [place] [address]  CBC     Status: Abnormal   Collection Time: 06/07/22  3:45 AM  Result Value Ref Range   WBC 13.0 (H) 4.0 - 10.5 K/uL   RBC 5.18 4.22 - 5.81 MIL/uL   Hemoglobin 14.9 13.0 - 17.0 g/dL   HCT 94.5 85.9 - 29.2 %   MCV 86.5 80.0 - 100.0 fL   MCH 28.8 26.0 - 34.0 pg   MCHC 33.3 30.0 - 36.0 g/dL   RDW 44.6 28.6 - 38.1 %   Platelets 262 150 - 400 K/uL   nRBC 0.0 0.0 - 0.2 %    Comment: Performed at [facility], [address], [place], [place] [address]    CT Renal Stone Study  Result Date: 06/07/2022 CLINICAL DATA:  Sudden onset/flank pain.  Kidney stone suspected. EXAM: CT  ABDOMEN AND PELVIS WITHOUT CONTRAST TECHNIQUE: Multidetector CT imaging of the abdomen and pelvis was performed following the standard protocol without IV contrast. RADIATION DOSE REDUCTION: This exam was performed according to the departmental dose-optimization program which includes automated exposure control, adjustment of the mA and/or kV according to patient size and/or use of iterative reconstruction technique. COMPARISON:  CT with contrast 03/07/2021. FINDINGS: Lower chest: No acute abnormality. Hepatobiliary: No focal liver abnormality is seen without contrast. The gallbladder and bile ducts are unremarkable. Pancreas: Unremarkable without contrast. Spleen: Unremarkable without contrast. Adrenals/Urinary Tract: There is no adrenal mass. No focal abnormality in the unenhanced renal cortex. No urinary stone or obstruction is seen. Bladder is contracted and poorly visualized. Stomach/Bowel: There is fluid distention of the stomach. There is fluid distention upper to mid abdominal small bowel with dilatation up to 3.2 cm. This is most likely due to an internal hernia, with twisting of the mesenteric vasculature in the low central abdomen noted. This was seen previously but was not associated with bowel obstruction at that time. There is mesenteric edema and edema in the involved bowel wall. The distal small bowel segments in the pelvis are normal caliber. The appendix is normal caliber. There is no evidence of colitis or diverticulitis. Vascular/Lymphatic: The unenhanced aorta is normal. There is no portal vein dilatation. No adenopathy is seen. Reproductive: Both testicles are in the scrotal sac. There is no prostatomegaly. Other: There is no anterior wall hernia. No bowel pneumatosis is seen at this time. There is scattered interloop fluid in the mesentery in the abdomen, minimal posterior pelvic ascites. There is no free air, free hemorrhage or abscess. Musculoskeletal: No acute or significant osseous  findings. IMPRESSION: 1. Small-bowel obstruction with bowel dilatation up to 3.2 cm, etiology believed to be an internal hernia with twisting of the mesenteric vasculature in the low central abdomen. This was seen previously but previously was not associated with a small bowel obstruction. 2. There are thickened folds in the dilated segments concerning for ischemia, although at this time there is no bowel pneumatosis or portal venous gas. There is mesenteric edema and scattered interloop fluid. 3. Urgent surgical consultation recommended. Electronically Signed   By: Almira Bar M.D.   On: 06/07/2022 05:19    Imaging: Personally reviewed  A/P: Kenneth Farley is an 21 y.o. male with Peritonitis SBO due to internal hernia Elevated LFTs Leukocytosis  Patient has what appears to be twisting of the small bowel mesentery consistent with an internal hernia causing a bowel obstruction.  There is evidence of edema in the mesentery and some bowel wall thickening  concerning for early ischemia.  The patient has early peritonitis on exam.  Patient was uncomfortable we had to give him another dose of IV pain medicine.  I discussed the diagnosis with the patient and his mother who was on the phone at the time.  Recommended urgent surgical exploration.  We will attempt diagnostic laparoscopy with possible exploratory laparotomy with possible bowel resection.  I discussed the procedure in detail.  We discussed the risks and benefits of surgery including, but not limited to bleeding, infection (such as wound infection, abdominal abscess), injury to surrounding structures, blood clot formation, urinary retention, incisional hernia, (anastomotic stricture, anastomotic leak if resection performed) anesthesia risks, pulmonary & cardiac complications such as pneumonia &/or heart issues, need for additional procedures, ileus, & prolonged hospitalization.  We discussed the typical postoperative recovery course, including  limitations & restrictions postoperatively. I explained that the likelihood of improvement in their symptoms is good.  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  Gallbladder looks grossly normal imaging however noncontrasted  IV abx IVF  High level of medical decision making-reviewed ED records, discussion with EDP, review of prior ED records and prior CT scan from 2022, discussion regarding reason for hospitalization as well as emergency surgery with inherent risks  Mary Sella. Andrey Campanile, MD, FACS General, Bariatric, & Minimally Invasive Surgery [facility], [place] w

## 2022-06-07 NOTE — Anesthesia Procedure Notes (Signed)
Procedure Name: Intubation Date/Time: 06/07/2022 7:47 AM  Performed by: Mayer Camel, CRNAPre-anesthesia Checklist: Patient identified, Emergency Drugs available, Suction available and Patient being monitored Patient Re-evaluated:Patient Re-evaluated prior to induction Oxygen Delivery Method: Circle System Utilized Preoxygenation: Pre-oxygenation with 100% oxygen Induction Type: IV induction, Rapid sequence and Cricoid Pressure applied Laryngoscope Size: Miller and 2 Grade View: Grade I Tube type: Oral Tube size: 7.5 mm Number of attempts: 1 Airway Equipment and Method: Stylet and Oral airway Placement Confirmation: ETT inserted through vocal cords under direct vision, positive ETCO2 and breath sounds checked- equal and bilateral Secured at: 22 cm Tube secured with: Tape Dental Injury: Teeth and Oropharynx as per pre-operative assessment

## 2022-06-08 ENCOUNTER — Encounter (HOSPITAL_COMMUNITY): Admission: EM | Disposition: A | Discharge status: Home / Self Care | Payer: Self-pay | Source: Home / Self Care

## 2022-06-08 ENCOUNTER — Inpatient Hospital Stay (HOSPITAL_COMMUNITY): Payer: 59 | Admitting: Certified Registered Nurse Anesthetist

## 2022-06-08 ENCOUNTER — Other Ambulatory Visit: Payer: Self-pay

## 2022-06-08 ENCOUNTER — Encounter (HOSPITAL_COMMUNITY): Payer: Self-pay | Admitting: General Surgery

## 2022-06-08 DIAGNOSIS — S31609A Unspecified open wound of abdominal wall, unspecified quadrant with penetration into peritoneal cavity, initial encounter: Secondary | ICD-10-CM

## 2022-06-08 HISTORY — PX: APPENDECTOMY: SHX54

## 2022-06-08 HISTORY — PX: LAPAROTOMY: SHX154

## 2022-06-08 LAB — CBC
HCT: 43.3 % (ref 39.0–52.0)
Hemoglobin: 14.7 g/dL (ref 13.0–17.0)
MCH: 28.3 pg (ref 26.0–34.0)
MCHC: 33.9 g/dL (ref 30.0–36.0)
MCV: 83.3 fL (ref 80.0–100.0)
Platelets: 244 10*3/uL (ref 150–400)
RBC: 5.2 MIL/uL (ref 4.22–5.81)
RDW: 12.7 % (ref 11.5–15.5)
WBC: 8.4 10*3/uL (ref 4.0–10.5)
nRBC: 0 % (ref 0.0–0.2)

## 2022-06-08 LAB — GLUCOSE, CAPILLARY
Glucose-Capillary: 151 mg/dL — ABNORMAL HIGH (ref 70–99)
Glucose-Capillary: 173 mg/dL — ABNORMAL HIGH (ref 70–99)
Glucose-Capillary: 99 mg/dL (ref 70–99)
Glucose-Capillary: 99 mg/dL (ref 70–99)
Glucose-Capillary: 92 mg/dL (ref 70–99)
Glucose-Capillary: 69 mg/dL — ABNORMAL LOW (ref 70–99)
Glucose-Capillary: 96 mg/dL (ref 70–99)
Glucose-Capillary: 57 mg/dL — ABNORMAL LOW (ref 70–99)
Glucose-Capillary: 98 mg/dL (ref 70–99)
Glucose-Capillary: 114 mg/dL — ABNORMAL HIGH (ref 70–99)
Glucose-Capillary: 112 mg/dL — ABNORMAL HIGH (ref 70–99)

## 2022-06-08 LAB — BASIC METABOLIC PANEL
Anion gap: 9 (ref 5–15)
BUN: 10 mg/dL (ref 6–20)
CO2: 23 mmol/L (ref 22–32)
Calcium: 7.9 mg/dL — ABNORMAL LOW (ref 8.9–10.3)
Chloride: 107 mmol/L (ref 98–111)
Creatinine, Ser: 1.17 mg/dL (ref 0.61–1.24)
GFR, Estimated: >60 mL/min (ref >60)
Glucose, Bld: 97 mg/dL (ref 70–99)
Potassium: 4.8 mmol/L (ref 3.5–5.1)
Sodium: 139 mmol/L (ref 135–145)

## 2022-06-08 LAB — TRIGLYCERIDES: Triglycerides: 52 mg/dL (ref <150)

## 2022-06-08 LAB — MAGNESIUM: Magnesium: 2 mg/dL (ref 1.7–2.4)

## 2022-06-08 LAB — PHOSPHORUS: Phosphorus: 3.4 mg/dL (ref 2.5–4.6)

## 2022-06-08 SURGERY — LAPAROTOMY, EXPLORATORY
Anesthesia: General | Site: Abdomen

## 2022-06-08 MED ORDER — FENTANYL CITRATE PF 50 MCG/ML IJ SOSY
50.0000 ug | PREFILLED_SYRINGE | INTRAMUSCULAR | Status: DC | PRN
  Administered 2022-06-09 (×2): 100 ug via INTRAVENOUS
  Administered 2022-06-09: 50 ug via INTRAVENOUS
  Administered 2022-06-09 – 2022-06-10 (×8): 100 ug via INTRAVENOUS
  Filled 2022-06-08 (×10): qty 2
  Filled 2022-06-08: qty 1

## 2022-06-08 MED ORDER — ENOXAPARIN SODIUM 40 MG/0.4ML IJ SOSY
40.0000 mg | PREFILLED_SYRINGE | INTRAMUSCULAR | Status: DC
  Administered 2022-06-09 – 2022-06-14 (×6): 40 mg via SUBCUTANEOUS
  Filled 2022-06-08 (×6): qty 0.4

## 2022-06-08 MED ORDER — ACETAMINOPHEN 10 MG/ML IV SOLN
INTRAVENOUS | Status: DC | PRN
  Administered 2022-06-08: 1000 mg via INTRAVENOUS

## 2022-06-08 MED ORDER — 0.9 % SODIUM CHLORIDE (POUR BTL) OPTIME
TOPICAL | Status: DC | PRN
  Administered 2022-06-08 (×2): 1000 mL

## 2022-06-08 MED ORDER — MIDAZOLAM HCL 2 MG/2ML IJ SOLN
INTRAMUSCULAR | Status: DC | PRN
  Administered 2022-06-08: 2 mg via INTRAVENOUS

## 2022-06-08 MED ORDER — ACETAMINOPHEN 10 MG/ML IV SOLN
INTRAVENOUS | Status: AC
  Filled 2022-06-08: qty 100

## 2022-06-08 MED ORDER — LACTATED RINGERS IV BOLUS
1000.0000 mL | Freq: Once | INTRAVENOUS | Status: AC
  Administered 2022-06-08: 1000 mL via INTRAVENOUS

## 2022-06-08 MED ORDER — LACTATED RINGERS IV SOLN: INTRAVENOUS | Status: DC | PRN

## 2022-06-08 MED ORDER — ROCURONIUM BROMIDE 10 MG/ML (PF) SYRINGE
PREFILLED_SYRINGE | INTRAVENOUS | Status: DC | PRN
  Administered 2022-06-08: 60 mg via INTRAVENOUS
  Administered 2022-06-08: 40 mg via INTRAVENOUS
  Administered 2022-06-08: 20 mg via INTRAVENOUS
  Administered 2022-06-08: 30 mg via INTRAVENOUS

## 2022-06-08 MED ORDER — DEXTROSE 50 % IV SOLN
1.0000 | Freq: Once | INTRAVENOUS | Status: AC
  Administered 2022-06-08: 12.5 mL via INTRAVENOUS

## 2022-06-08 MED ORDER — ALBUMIN HUMAN 5 % IV SOLN: INTRAVENOUS | Status: DC | PRN

## 2022-06-08 MED ORDER — ROCURONIUM BROMIDE 10 MG/ML (PF) SYRINGE
PREFILLED_SYRINGE | INTRAVENOUS | Status: AC
  Filled 2022-06-08: qty 50

## 2022-06-08 MED ORDER — DEXTROSE 50 % IV SOLN
INTRAVENOUS | Status: AC
  Filled 2022-06-08: qty 50

## 2022-06-08 MED ORDER — MIDAZOLAM HCL 2 MG/2ML IJ SOLN
INTRAMUSCULAR | Status: AC
  Filled 2022-06-08: qty 2

## 2022-06-08 MED ORDER — FENTANYL CITRATE (PF) 250 MCG/5ML IJ SOLN
INTRAMUSCULAR | Status: DC | PRN
  Administered 2022-06-08 (×2): 100 ug via INTRAVENOUS
  Administered 2022-06-08: 50 ug via INTRAVENOUS

## 2022-06-08 MED ORDER — FENTANYL CITRATE (PF) 250 MCG/5ML IJ SOLN
INTRAMUSCULAR | Status: AC
  Filled 2022-06-08: qty 5

## 2022-06-08 MED ORDER — SODIUM CHLORIDE 0.9 % IV SOLN
2.0000 g | Freq: Once | INTRAVENOUS | Status: AC
  Administered 2022-06-08: 2 g via INTRAVENOUS
  Filled 2022-06-08: qty 2

## 2022-06-08 MED ORDER — FENTANYL CITRATE PF 50 MCG/ML IJ SOSY: 50.0000 ug | PREFILLED_SYRINGE | Freq: Once | INTRAMUSCULAR | Status: DC

## 2022-06-08 MED ORDER — DEXTROSE 50 % IV SOLN: 12.5000 g | INTRAVENOUS | Status: AC

## 2022-06-08 MED ORDER — DEXTROSE 50 % IV SOLN
12.5000 g | INTRAVENOUS | Status: AC
  Administered 2022-06-08: 12.5 g via INTRAVENOUS

## 2022-06-08 SURGICAL SUPPLY — 47 items
APL PRP STRL LF DISP 70% ISPRP (MISCELLANEOUS) ×1
BAG COUNTER SPONGE SURGICOUNT (BAG) ×2 IMPLANT
BAG SPNG CNTER NS LX DISP (BAG) ×1
BLADE CLIPPER SURG (BLADE) IMPLANT
CANISTER SUCT 3000ML PPV (MISCELLANEOUS) ×2 IMPLANT
CHLORAPREP W/TINT 26 (MISCELLANEOUS) ×2 IMPLANT
COVER SURGICAL LIGHT HANDLE (MISCELLANEOUS) ×2 IMPLANT
CUTTER FLEX LINEAR 45M (STAPLE) ×1 IMPLANT
DRAPE LAPAROSCOPIC ABDOMINAL (DRAPES) ×2 IMPLANT
DRAPE WARM FLUID 44X44 (DRAPES) ×2 IMPLANT
DRSG COVADERM PLUS 2X2 (GAUZE/BANDAGES/DRESSINGS) ×1 IMPLANT
DRSG OPSITE POSTOP 4X10 (GAUZE/BANDAGES/DRESSINGS) IMPLANT
DRSG OPSITE POSTOP 4X12 (GAUZE/BANDAGES/DRESSINGS) ×1 IMPLANT
DRSG OPSITE POSTOP 4X8 (GAUZE/BANDAGES/DRESSINGS) IMPLANT
ELECT BLADE 6.5 EXT (BLADE) IMPLANT
ELECT CAUTERY BLADE 6.4 (BLADE) ×2 IMPLANT
ELECT REM PT RETURN 9FT ADLT (ELECTROSURGICAL) ×2
ELECTRODE REM PT RTRN 9FT ADLT (ELECTROSURGICAL) ×1 IMPLANT
GLOVE BIO SURGEON STRL SZ8 (GLOVE) ×2 IMPLANT
GLOVE BIOGEL PI IND STRL 8 (GLOVE) ×1 IMPLANT
GLOVE BIOGEL PI INDICATOR 8 (GLOVE) ×1
GOWN STRL REUS W/ TWL LRG LVL3 (GOWN DISPOSABLE) ×1 IMPLANT
GOWN STRL REUS W/ TWL XL LVL3 (GOWN DISPOSABLE) ×1 IMPLANT
GOWN STRL REUS W/TWL LRG LVL3 (GOWN DISPOSABLE) ×2
GOWN STRL REUS W/TWL XL LVL3 (GOWN DISPOSABLE) ×2
HANDLE SUCTION POOLE (INSTRUMENTS) ×1 IMPLANT
KIT BASIN OR (CUSTOM PROCEDURE TRAY) ×2 IMPLANT
KIT TURNOVER KIT B (KITS) ×2 IMPLANT
LIGASURE IMPACT 36 18CM CVD LR (INSTRUMENTS) IMPLANT
NS IRRIG 1000ML POUR BTL (IV SOLUTION) ×4 IMPLANT
PACK GENERAL/GYN (CUSTOM PROCEDURE TRAY) ×2 IMPLANT
PAD ARMBOARD 7.5X6 YLW CONV (MISCELLANEOUS) ×2 IMPLANT
PENCIL SMOKE EVACUATOR (MISCELLANEOUS) ×2 IMPLANT
RELOAD STAPLE 45 3.5 BLU ETS (ENDOMECHANICALS) IMPLANT
RELOAD STAPLE TA45 3.5 REG BLU (ENDOMECHANICALS) ×2 IMPLANT
SPECIMEN JAR LARGE (MISCELLANEOUS) IMPLANT
SPONGE T-LAP 18X18 ~~LOC~~+RFID (SPONGE) IMPLANT
STAPLER VISISTAT 35W (STAPLE) ×2 IMPLANT
SUCTION POOLE HANDLE (INSTRUMENTS) ×2
SUT PDS AB 1 TP1 96 (SUTURE) ×4 IMPLANT
SUT SILK 2 0 SH CR/8 (SUTURE) ×2 IMPLANT
SUT SILK 2 0 TIES 10X30 (SUTURE) ×2 IMPLANT
SUT SILK 3 0 SH CR/8 (SUTURE) ×2 IMPLANT
SUT SILK 3 0 TIES 10X30 (SUTURE) ×2 IMPLANT
TOWEL GREEN STERILE (TOWEL DISPOSABLE) ×2 IMPLANT
TRAY FOLEY MTR SLVR 16FR STAT (SET/KITS/TRAYS/PACK) IMPLANT
YANKAUER SUCT BULB TIP NO VENT (SUCTIONS) IMPLANT

## 2022-06-08 NOTE — Anesthesia Postprocedure Evaluation (Signed)
Anesthesia Post Note  Patient: Kenneth Farley  Procedure(s) Performed: RE-EXPLORATORY LAPAROTOMY, REMOVAL OF HERNIA SAC, ABDOMINAL CLOSURE (Abdomen) APPENDECTOMY (Abdomen)     Patient location during evaluation: SICU Anesthesia Type: General Level of consciousness: sedated Pain management: pain level controlled Vital Signs Assessment: post-procedure vital signs reviewed and stable Respiratory status: patient remains intubated per anesthesia plan Cardiovascular status: stable Postop Assessment: no apparent nausea or vomiting Anesthetic complications: no   No notable events documented.  Last Vitals:  Vitals:   06/08/22 1220 06/08/22 1228  BP:  116/80  Pulse: 96 93  Resp: 16 16  Temp: (!) 38.6 C (!) 38.7 C  SpO2: 100% 100%    Last Pain:  Vitals:   06/08/22 1058  TempSrc: Oral  PainSc:                  Shelton Silvas

## 2022-06-08 NOTE — Anesthesia Procedure Notes (Signed)
Date/Time: 06/08/2022 11:11 AM  Performed by: Jodell Cipro, CRNAInduction Type: Inhalational induction with existing ETT Placement Confirmation: positive ETCO2 Dental Injury: Teeth and Oropharynx as per pre-operative assessment

## 2022-06-08 NOTE — Transfer of Care (Signed)
Immediate Anesthesia Transfer of Care Note  Patient: Kenneth Farley  Procedure(s) Performed: RE-EXPLORATORY LAPAROTOMY, REMOVAL OF HERNIA SAC, ABDOMINAL CLOSURE (Abdomen) APPENDECTOMY (Abdomen)  Patient Location: ICU  Anesthesia Type:General  Level of Consciousness: sedated and Patient remains intubated per anesthesia plan  Airway & Oxygen Therapy: Patient remains intubated per anesthesia plan and Patient placed on Ventilator (see vital sign flow sheet for setting)  Post-op Assessment: Report given to RN and Post -op Vital signs reviewed and stable  Post vital signs: Reviewed and stable  Last Vitals:  Vitals Value Taken Time  BP    Temp 38.6 C 06/08/22 1219  Pulse 96 06/08/22 1219  Resp 16 06/08/22 1219  SpO2 100 % 06/08/22 1219  Vitals shown include unvalidated device data.  Last Pain:  Vitals:   06/08/22 1058  TempSrc: Oral  PainSc:       Patients Stated Pain Goal: 3 (06/07/22 0726)  Complications: No notable events documented.

## 2022-06-08 NOTE — Progress Notes (Signed)
Patient ID: Kenneth Farley, male   DOB: Mar 15, 2001, 21 y.o.   MRN: [id number] 1 Day Post-Op    Subjective: On vent ROS negative except as listed above. Objective: Vital signs in last 24 hours: Temp:  [97.9 F (36.6 C)-99.5 F (37.5 C)] 99.5 F (37.5 C) (07/31 0715) Pulse Rate:  [62-90] 90 (07/31 0810) Resp:  [16-21] 16 (07/31 0810) BP: (88-160)/(53-114) 111/82 (07/31 0810) SpO2:  [98 %-100 %] 100 % (07/31 0810) FiO2 (%):  [40 %-100 %] 40 % (07/31 0810) Last BM Date :  (prior to admission)  Intake/Output from previous day: 07/30 0701 - 07/31 0700 In: 5456 [I.V.:5356; IV Piggyback:100] Out: 4125 [Urine:1750; Emesis/NG output:700; Drains:1125; Blood:50] Intake/Output this shift: Total I/O In: 293 [I.V.:293] Out: -   General appearance: cooperative and no distress Resp: clear to auscultation bilaterally Cardio: regular rate and rhythm GI: open abd VAC Extremities: clavessoft  Lab Results: CBC  Recent Labs    06/07/22 0345 06/07/22 1716 06/08/22 0735  WBC 13.0*  --  8.4  HGB 14.9 13.1 14.7  HCT 44.8 37.3* 43.3  PLT 262  --  244   BMET Recent Labs    06/07/22 0345 06/08/22 0635  NA 138 139  K 3.2* 4.8  CL 105 107  CO2 24 23  GLUCOSE 178* 97  BUN 10 10  CREATININE 1.03 1.17  CALCIUM 8.3* 7.9*   PT/INR No results for input(s): "LABPROT", "INR" in the last 72 hours. ABG No results for input(s): "PHART", "HCO3" in the last 72 hours.  Invalid input(s): "PCO2", "PO2"  Studies/Results: CT Renal Stone Study  Result Date: 06/07/2022 CLINICAL DATA:  Sudden onset/flank pain.  Kidney stone suspected. EXAM: CT ABDOMEN AND PELVIS WITHOUT CONTRAST TECHNIQUE: Multidetector CT imaging of the abdomen and pelvis was performed following the standard protocol without IV contrast. RADIATION DOSE REDUCTION: This exam was performed according to the departmental dose-optimization program which includes automated exposure control, adjustment of the mA and/or kV according to  patient size and/or use of iterative reconstruction technique. COMPARISON:  CT with contrast 03/07/2021. FINDINGS: Lower chest: No acute abnormality. Hepatobiliary: No focal liver abnormality is seen without contrast. The gallbladder and bile ducts are unremarkable. Pancreas: Unremarkable without contrast. Spleen: Unremarkable without contrast. Adrenals/Urinary Tract: There is no adrenal mass. No focal abnormality in the unenhanced renal cortex. No urinary stone or obstruction is seen. Bladder is contracted and poorly visualized. Stomach/Bowel: There is fluid distention of the stomach. There is fluid distention upper to mid abdominal small bowel with dilatation up to 3.2 cm. This is most likely due to an internal hernia, with twisting of the mesenteric vasculature in the low central abdomen noted. This was seen previously but was not associated with bowel obstruction at that time. There is mesenteric edema and edema in the involved bowel wall. The distal small bowel segments in the pelvis are normal caliber. The appendix is normal caliber. There is no evidence of colitis or diverticulitis. Vascular/Lymphatic: The unenhanced aorta is normal. There is no portal vein dilatation. No adenopathy is seen. Reproductive: Both testicles are in the scrotal sac. There is no prostatomegaly. Other: There is no anterior wall hernia. No bowel pneumatosis is seen at this time. There is scattered interloop fluid in the mesentery in the abdomen, minimal posterior pelvic ascites. There is no free air, free hemorrhage or abscess. Musculoskeletal: No acute or significant osseous findings. IMPRESSION: 1. Small-bowel obstruction with bowel dilatation up to 3.2 cm, etiology believed to be an internal hernia with twisting  of the mesenteric vasculature in the low central abdomen. This was seen previously but previously was not associated with a small bowel obstruction. 2. There are thickened folds in the dilated segments concerning for  ischemia, although at this time there is no bowel pneumatosis or portal venous gas. There is mesenteric edema and scattered interloop fluid. 3. Urgent surgical consultation recommended. Electronically Signed   By: Almira Bar M.D.   On: 06/07/2022 05:19    Anti-infectives: Anti-infectives (From admission, onward)    Start     Dose/Rate Route Frequency Ordered Stop   06/07/22 0630  cefoTEtan (CEFOTAN) 2 g in sodium chloride 0.9 % 100 mL IVPB        2 g 200 mL/hr over 30 Minutes Intravenous  Once 06/07/22 7322 06/07/22 0825       Assessment/Plan: Status post diagnostic laparoscopy, lysis of adhesions, reduction of internal hernia, and placement of ABThera VAC 7/30 by Dr. Sheliah Hatch -doing well.  No pressors.  Will return to the operating room today for reexploration, possible bowel resection and possible ostomy(unlikely).  I will speak with his mom in detail when she arrives.Cefotetan IV.  LOS: 1 day    Violeta Gelinas, MD, MPH, FACS Trauma & General Surgery Use [email or web address] to contact on call provider  06/08/2022

## 2022-06-08 NOTE — Progress Notes (Signed)
Patient ID: Kenneth Farley, male   DOB: [birth year], 21 y.o.   MRN: [id number] I met with his mother at the bedside and discussed his progress as well as the planned return to the OR for reexploration, possible bowel resection and possible ostomy(unlikely). I discussed the provedure, risks, and benefits. She agrees.  Georganna Skeans, MD, MPH, FACS Please use [email or web address] to contact on call provider

## 2022-06-08 NOTE — Procedures (Signed)
Extubation Procedure Note  Patient Details:   Name: Sam Overbeck DOB: [birth year] MRN: [id number]   Airway Documentation:    Vent end date: 06/08/22 Vent end time: 1640   Evaluation  O2 sats: stable throughout Complications: No apparent complications Patient did tolerate procedure well. Bilateral Breath Sounds: Clear   Yes  Pt weaned PS 5/5 and cuff leak detected. RT extubated pt to 4L [place]. No complications noted.  Hiram Comber 06/08/2022, 4:51 PM

## 2022-06-08 NOTE — Anesthesia Preprocedure Evaluation (Addendum)
Anesthesia Evaluation  Patient identified by MRN, date of birth, ID band Patient unresponsive    Reviewed: Allergy & Precautions, Patient's Chart, lab work & pertinent test results, Unable to perform ROS - Chart review only  Airway Mallampati: Intubated       Dental   Pulmonary neg pulmonary ROS,     + decreased breath sounds      Cardiovascular negative cardio ROS   Rhythm:Regular Rate:Tachycardia     Neuro/Psych negative neurological ROS  negative psych ROS   GI/Hepatic negative GI ROS, Neg liver ROS,   Endo/Other  negative endocrine ROS  Renal/GU negative Renal ROS     Musculoskeletal negative musculoskeletal ROS (+)   Abdominal   Peds  Hematology negative hematology ROS (+)   Anesthesia Other Findings   Reproductive/Obstetrics                            Anesthesia Physical Anesthesia Plan  ASA: 4  Anesthesia Plan: General   Post-op Pain Management: Ofirmev IV (intra-op)*   Induction: Inhalational  PONV Risk Score and Plan: 0 and Treatment may vary due to age or medical condition  Airway Management Planned:   Additional Equipment: None  Intra-op Plan:   Post-operative Plan: Post-operative intubation/ventilation  Informed Consent:     History available from chart only  Plan Discussed with: CRNA  Anesthesia Plan Comments:        Anesthesia Quick Evaluation

## 2022-06-08 NOTE — Op Note (Signed)
  06/08/2022  12:00 PM  PATIENT:  Kenneth Farley  21 y.o. male  PRE-OPERATIVE DIAGNOSIS:  OPEN ABDOMEN  POST-OPERATIVE DIAGNOSIS:  OPEN ABDOMEN  PROCEDURE:  Procedure(s): RE-EXPLORATORY LAPAROTOMY REMOVAL OF HERNIA SAC APPENDECTOMY ABDOMINAL CLOSURE   SURGEON:  Surgeon(s): Violeta Gelinas, MD  ASSISTANTS: Bailey Mech, PA-C   ANESTHESIA:   general  EBL:  Total I/O In: 1264.3 [I.V.:914.3; IV Piggyback:350] Out: 410 [Urine:50; Emesis/NG output:50; Drains:300; Blood:10]  BLOOD ADMINISTERED:none  DRAINS: none   SPECIMEN:  Excision  DISPOSITION OF SPECIMEN:  PATHOLOGY  COUNTS:  YES  DICTATION: .Dragon Dictation Findings: Devascularized appendix with some ischemia, entirety of small bowel was viable, previous proximal small bowel anastomosis 12 cm distal to LOT intact.  Procedure in detail: Informed consent was obtained.  He was taken directly from the intensive care unit to the operating room.  He received IV antibiotics.  General anesthesia was administered by the anesthesia team.  The outer portions of his VAC were removed and his abdomen was prepped and draped in a sterile fashion.  We did a timeout procedure.  I gently irrigated the inner VAC drape and removed it from the abdomen.  Abdominal fluid was serosanguineous with no evidence of succus.  I gently freed up filmy adhesions that had formed since surgery yesterday and ran the bowel in its entirety from the terminal ileum back to the ligament of Treitz.  The small bowel, while hyperemic in some places, was completely viable without any perforations.  He has a hole anastomosis about 12 cm distal to the ligament of Treitz that was also intact.  I placed an additional 3-0 silk suture there to close a thinned out area.  Again there was no perforation and it appears that this was likely partly involved with the internal hernia.  I inspected the right colon thoroughly and it was intact but, the appendix appeared dusky and  devascularized.  It was removed by dividing the base with Endo GIA stapler with a blue load.  There was excellent closure.  We sent it to pathology.  There was a long piece of the hernia sac which was down and the lower abdomen.  This was divided between clamps and removed.  This was also sent to pathology.  The clamps were securely tied with 2-0 silk.  There was excellent hemostasis.  We then ran the bowel again and all appeared okay.  Decision was made to close.  The abdomen was irrigated.  There was good hemostasis.  Bowel was returned to anatomic position.  What little omentum he had was brought over the bowel and the fascia was closed with running #1 looped PDS.  The skin in the midline was irrigated and closed with staples.  We also placed a staple in her his old port site.  All counts were correct.  He tolerated the procedure well without apparent complication and was taken directly back to the intensive care unit on the ventilator. PATIENT DISPOSITION:  ICU - intubated and critically ill.   Delay start of Pharmacological VTE agent (>24hrs) due to surgical blood loss or risk of bleeding:  no  Violeta Gelinas, MD, MPH, FACS Pager: [phone]  7/31/202312:00 PM

## 2022-06-08 NOTE — Progress Notes (Signed)
Pt w/ increased agitation, ineffective respiratory. Versed given. Will continue to monitor for resolution of anesthesia and SBT w/ narrow window for extubating this young patient before overwhelmed by anxiety w/ intubation. Have collaborated w/ RT W plan.

## 2022-06-08 NOTE — Progress Notes (Signed)
Patient ID: Kenneth Farley, male   DOB: [birth year], 21 y.o.   MRN: [id number] Doing well post-extubation. Precedex, fentanyl pushes PRN.  Violeta Gelinas, MD, MPH, FACS Please use [email or web address] to contact on call provider

## 2022-06-08 NOTE — Progress Notes (Signed)
Patient ID: Kenneth Farley, male   DOB: [birth year], 21 y.o.   MRN: [id number] Follow up - Trauma Critical Care   Patient Details:    Kenneth Farley is an 21 y.o. male.  Lines/tubes : Airway 7.5 mm (Active)  Secured at (cm) 22 cm 06/08/22 1228  Measured From Lips 06/08/22 1228  Secured Location Left 06/08/22 1228  Secured By [employer] 06/08/22 1228  Tube Holder Repositioned Yes 06/08/22 1228  Prone position No 06/08/22 1228  Cuff Pressure (cm H2O) Clear OR 27-39 CmH2O 06/08/22 0810  Site Condition Dry 06/08/22 1228     NG/OG Vented/Dual Lumen Left nare (Active)  Tube Position (Required) Marking at nare/corner of mouth 06/08/22 0800  Ongoing Placement Verification (Required) (See row information) Yes 06/08/22 0800  Site Assessment Clean, Dry, Intact 06/08/22 0800  Interventions Retaped 06/08/22 0800  Status Low intermittent suction 06/08/22 0800  Output (mL) 50 mL 06/08/22 1016     Urethral Catheter Kerrin Champagne, RN Latex;Straight-tip 16 Fr. (Active)  Indication for Insertion or Continuance of Catheter Unstable critically ill patients first 24-48 hours (See Criteria) 06/08/22 0800  Site Assessment Clean, Dry, Intact 06/08/22 0800  Catheter Maintenance Bag below level of bladder;Catheter secured;Drainage bag/tubing not touching floor;Insertion date on drainage bag;No dependent loops;Seal intact;Bag emptied prior to transport 06/08/22 0800  Collection Container Standard drainage bag 06/08/22 0800  Securement Method Securing device (Describe) 06/08/22 0800  Urinary Catheter Interventions (if applicable) Unclamped 06/07/22 2000  Output (mL) 300 mL 06/08/22 0600    Microbiology/Sepsis markers: Results for orders placed or performed during the hospital encounter of 06/07/22  MRSA Next Gen by PCR, Nasal     Status: None   Collection Time: 06/07/22 11:45 AM   Specimen: Nasal Mucosa; Nasal Swab  Result Value Ref Range Status   MRSA by PCR Next Gen NOT DETECTED NOT DETECTED  Final    Comment: (NOTE) The GeneXpert MRSA Assay (FDA approved for NASAL specimens only), is one component of a comprehensive MRSA colonization surveillance program. It is not intended to diagnose MRSA infection nor to guide or monitor treatment for MRSA infections. Test performance is not FDA approved in patients less than 17 years old. Performed at [facility], [address] [place], [place] [address]     Anti-infectives:  Anti-infectives (From admission, onward)    Start     Dose/Rate Route Frequency Ordered Stop   06/08/22 1100  cefoTEtan (CEFOTAN) 2 g in sodium chloride 0.9 % 100 mL IVPB        2 g 200 mL/hr over 30 Minutes Intravenous  Once 06/08/22 0916 06/08/22 1115   06/07/22 0630  cefoTEtan (CEFOTAN) 2 g in sodium chloride 0.9 % 100 mL IVPB        2 g 200 mL/hr over 30 Minutes Intravenous  Once 06/07/22 0629 06/07/22 0825     Consults: Treatment Team:  Montez Morita, Md, MD    Studies:    Events:  Subjective:    Overnight Issues:   Objective:  Vital signs for last 24 hours: Temp:  [97.9 F (36.6 C)-102.4 F (39.1 C)] 99.7 F (37.6 C) (07/31 1400) Pulse Rate:  [62-96] 84 (07/31 1400) Resp:  [16-17] 16 (07/31 1400) BP: (88-132)/(53-85) 106/64 (07/31 1400) SpO2:  [98 %-100 %] 100 % (07/31 1400) FiO2 (%):  [40 %] 40 % (07/31 1400)  Hemodynamic parameters for last 24 hours:    Intake/Output from previous day: 07/30 0701 - 07/31 0700 In: 5456 [I.V.:5356; IV Piggyback:100] Out: 4125 [  Urine:1750; Emesis/NG output:700; Drains:1125; Blood:50]  Intake/Output this shift: Total I/O In: 1938.7 [I.V.:1338.7; IV Piggyback:600] Out: 410 [Urine:50; Emesis/NG output:50; Drains:300; Blood:10]  Vent settings for last 24 hours: Vent Mode: PRVC FiO2 (%):  [40 %] 40 % Set Rate:  [16 bmp] 16 bmp Vt Set:  [630 mL] 630 mL PEEP:  [5 cmH20] 5 cmH20 Plateau Pressure:  [17 cmH20-19 cmH20] 19 cmH20  Physical Exam:  General: on vent HEENT/Neck: ETT Resp: clear to  auscultation bilaterally CVS: RRR GI: dressing CDI post-op Extremities: calves soft  Results for orders placed or performed during the hospital encounter of 06/07/22 (from the past 24 hour(s))  Glucose, capillary     Status: Abnormal   Collection Time: 06/07/22  4:02 PM  Result Value Ref Range   Glucose-Capillary 181 (H) 70 - 99 mg/dL  Hemoglobin and hematocrit, blood     Status: Abnormal   Collection Time: 06/07/22  5:16 PM  Result Value Ref Range   Hemoglobin 13.1 13.0 - 17.0 g/dL   HCT 42.6 (L) 83.4 - 19.6 %  Glucose, capillary     Status: Abnormal   Collection Time: 06/07/22  7:04 PM  Result Value Ref Range   Glucose-Capillary 173 (H) 70 - 99 mg/dL  Glucose, capillary     Status: Abnormal   Collection Time: 06/07/22 11:14 PM  Result Value Ref Range   Glucose-Capillary 147 (H) 70 - 99 mg/dL  Glucose, capillary     Status: None   Collection Time: 06/08/22  3:21 AM  Result Value Ref Range   Glucose-Capillary 99 70 - 99 mg/dL  Basic metabolic panel     Status: Abnormal   Collection Time: 06/08/22  6:35 AM  Result Value Ref Range   Sodium 139 135 - 145 mmol/L   Potassium 4.8 3.5 - 5.1 mmol/L   Chloride 107 98 - 111 mmol/L   CO2 23 22 - 32 mmol/L   Glucose, Bld 97 70 - 99 mg/dL   BUN 10 6 - 20 mg/dL   Creatinine, Ser 2.22 0.61 - 1.24 mg/dL   Calcium 7.9 (L) 8.9 - 10.3 mg/dL   GFR, Estimated >97 >98 mL/min   Anion gap 9 5 - 15  Phosphorus     Status: None   Collection Time: 06/08/22  6:35 AM  Result Value Ref Range   Phosphorus 3.4 2.5 - 4.6 mg/dL  Magnesium     Status: None   Collection Time: 06/08/22  6:35 AM  Result Value Ref Range   Magnesium 2.0 1.7 - 2.4 mg/dL  Triglycerides     Status: None   Collection Time: 06/08/22  6:35 AM  Result Value Ref Range   Triglycerides 52 <150 mg/dL  Glucose, capillary     Status: None   Collection Time: 06/08/22  7:12 AM  Result Value Ref Range   Glucose-Capillary 99 70 - 99 mg/dL  CBC     Status: None   Collection Time:  06/08/22  7:35 AM  Result Value Ref Range   WBC 8.4 4.0 - 10.5 K/uL   RBC 5.20 4.22 - 5.81 MIL/uL   Hemoglobin 14.7 13.0 - 17.0 g/dL   HCT 92.1 19.4 - 17.4 %   MCV 83.3 80.0 - 100.0 fL   MCH 28.3 26.0 - 34.0 pg   MCHC 33.9 30.0 - 36.0 g/dL   RDW 08.1 44.8 - 18.5 %   Platelets 244 150 - 400 K/uL   nRBC 0.0 0.0 - 0.2 %  Glucose, capillary  Status: None   Collection Time: 06/08/22 10:57 AM  Result Value Ref Range   Glucose-Capillary 92 70 - 99 mg/dL  Glucose, capillary     Status: Abnormal   Collection Time: 06/08/22 12:14 PM  Result Value Ref Range   Glucose-Capillary 69 (L) 70 - 99 mg/dL  Glucose, capillary     Status: None   Collection Time: 06/08/22 12:38 PM  Result Value Ref Range   Glucose-Capillary 96 70 - 99 mg/dL    Assessment & Plan: Present on Admission:  Abdominal pain  Internal hernia    LOS: 1 day   Additional comments:I reviewed the patient's new clinical lab test results. / SBO from internal hernia  Status post diagnostic laparoscopy, lysis of adhesions, reduction of internal hernia, and placement of ABThera VAC 7/30 by Dr. Sheliah Hatch  S/P re-exploration, appendectomy, resection of sac, and closure 7/31 by Dr. Janee Morn - AROBF, NGT to LIWS Acute hypoxic ventilator dependent respiratory failure - did not wean well this PM yet but will continue attempts FEN - LR bolus now VTE - LMWH in AM Dispo - ICU, vent Critical Care Total Time*: 34 Minutes  Violeta Gelinas, MD, MPH, FACS Trauma & General Surgery Use [email or web address] to contact on call provider  06/08/2022  *Care during the described time interval was provided by me. I have reviewed this patient's available data, including medical history, events of note, physical examination and test results as part of my evaluation.

## 2022-06-09 ENCOUNTER — Encounter (HOSPITAL_COMMUNITY): Payer: Self-pay | Admitting: General Surgery

## 2022-06-09 LAB — BASIC METABOLIC PANEL
Anion gap: 5 (ref 5–15)
BUN: 8 mg/dL (ref 6–20)
CO2: 26 mmol/L (ref 22–32)
Calcium: 7.7 mg/dL — ABNORMAL LOW (ref 8.9–10.3)
Chloride: 106 mmol/L (ref 98–111)
Creatinine, Ser: 0.97 mg/dL (ref 0.61–1.24)
GFR, Estimated: >60 mL/min (ref >60)
Glucose, Bld: 117 mg/dL — ABNORMAL HIGH (ref 70–99)
Potassium: 3.9 mmol/L (ref 3.5–5.1)
Sodium: 137 mmol/L (ref 135–145)

## 2022-06-09 LAB — CBC
HCT: 36.6 % — ABNORMAL LOW (ref 39.0–52.0)
Hemoglobin: 12.5 g/dL — ABNORMAL LOW (ref 13.0–17.0)
MCH: 28.3 pg (ref 26.0–34.0)
MCHC: 34.2 g/dL (ref 30.0–36.0)
MCV: 83 fL (ref 80.0–100.0)
Platelets: 223 10*3/uL (ref 150–400)
RBC: 4.41 MIL/uL (ref 4.22–5.81)
RDW: 12.6 % (ref 11.5–15.5)
WBC: 11.8 10*3/uL — ABNORMAL HIGH (ref 4.0–10.5)
nRBC: 0 % (ref 0.0–0.2)

## 2022-06-09 LAB — GLUCOSE, CAPILLARY
Glucose-Capillary: 111 mg/dL — ABNORMAL HIGH (ref 70–99)
Glucose-Capillary: 102 mg/dL — ABNORMAL HIGH (ref 70–99)
Glucose-Capillary: 127 mg/dL — ABNORMAL HIGH (ref 70–99)
Glucose-Capillary: 107 mg/dL — ABNORMAL HIGH (ref 70–99)
Glucose-Capillary: 122 mg/dL — ABNORMAL HIGH (ref 70–99)
Glucose-Capillary: 114 mg/dL — ABNORMAL HIGH (ref 70–99)

## 2022-06-09 LAB — SURGICAL PATHOLOGY

## 2022-06-09 MED ORDER — METHOCARBAMOL 1000 MG/10ML IJ SOLN
1000.0000 mg | Freq: Three times a day (TID) | INTRAVENOUS | Status: DC
  Administered 2022-06-09 – 2022-06-11 (×7): 1000 mg via INTRAVENOUS
  Filled 2022-06-09 (×10): qty 10

## 2022-06-09 MED ORDER — ACETAMINOPHEN 10 MG/ML IV SOLN
1000.0000 mg | Freq: Four times a day (QID) | INTRAVENOUS | Status: AC
Start: 2022-06-09 — End: 2022-06-10
  Administered 2022-06-09 – 2022-06-10 (×4): 1000 mg via INTRAVENOUS
  Filled 2022-06-09 (×4): qty 100

## 2022-06-09 NOTE — Anesthesia Postprocedure Evaluation (Signed)
Anesthesia Post Note  Patient: Kenneth Farley  Procedure(s) Performed: EXPLORATORY LAPAROTOMY (Abdomen) APPLICATION OF WOUND VAC (Abdomen)     Patient location during evaluation: ICU Anesthesia Type: General Level of consciousness: awake and alert, patient cooperative and oriented Pain management: pain level controlled Vital Signs Assessment: post-procedure vital signs reviewed and stable Respiratory status: nonlabored ventilation, spontaneous breathing, respiratory function stable and patient connected to nasal cannula oxygen Postop Assessment: no apparent nausea or vomiting Anesthetic complications: no Comments: Pt extubated yesterday, VSS    No notable events documented.  Last Vitals:  Vitals:   06/09/22 0700 06/09/22 0714  BP: 104/66   Pulse: 77   Resp: (!) 28   Temp:  37.1 C  SpO2: 100%     Last Pain:  Vitals:   06/09/22 0714  TempSrc: Oral  PainSc:                  Kenneth Farley,E. Kenneth Farley

## 2022-06-09 NOTE — TOC Progression Note (Signed)
Transition of Care [facility]) - Progression Note    Patient Details  Name: Kenneth Farley MRN: [id number] Date of Birth: 04-Aug-2001  Transition of Care [facility]) CM/SW Contact  Beckie Busing, RN Phone Number:[phone]  06/09/2022, 2:49 PM  Clinical Narrative:    TOC following patient admitted with abdominal pain. Schedules to undergo laparoscopy with possible exploratory laparotomy with possible bowel resection. Currently there are no TOC needs. TOC will continue to follow.  Transition of Care (TOC) Screening Note               Expected Discharge Plan and Services                                                 Social Determinants of Health (SDOH) Interventions    Readmission Risk Interventions     No data to display

## 2022-06-09 NOTE — Progress Notes (Signed)
Patient ID: Kenneth Farley, male   DOB: 11-23-2000, 22 y.o.   MRN: [id number] Follow up - Trauma Critical Care   Patient Details:    Kenneth Farley is an 21 y.o. male.  Lines/tubes : NG/OG Vented/Dual Lumen Left nare (Active)  Tube Position (Required) Marking at nare/corner of mouth 06/09/22 0400  Ongoing Placement Verification (Required) (See row information) Yes 06/09/22 0400  Site Assessment Clean, Dry, Intact;Tape intact 06/09/22 0400  Interventions Other (Comment) 06/09/22 0400  Status Low intermittent suction 06/09/22 0400  Amount of suction 74 mmHg 06/08/22 2000  Output (mL) 150 mL 06/09/22 0636     Urethral Catheter Kerrin Champagne, RN Latex;Straight-tip 16 Fr. (Active)  Indication for Insertion or Continuance of Catheter Unstable critically ill patients first 24-48 hours (See Criteria) 06/08/22 2000  Site Assessment Clean, Dry, Intact 06/09/22 0400  Catheter Maintenance Bag below level of bladder;Catheter secured;Drainage bag/tubing not touching floor;Insertion date on drainage bag;No dependent loops;Seal intact 06/09/22 0400  Collection Container Standard drainage bag 06/09/22 0400  Securement Method Securing device (Describe) 06/09/22 0400  Urinary Catheter Interventions (if applicable) Unclamped 06/09/22 0400  Output (mL) 450 mL 06/09/22 0636    Microbiology/Sepsis markers: Results for orders placed or performed during the hospital encounter of 06/07/22  MRSA Next Gen by PCR, Nasal     Status: None   Collection Time: 06/07/22 11:45 AM   Specimen: Nasal Mucosa; Nasal Swab  Result Value Ref Range Status   MRSA by PCR Next Gen NOT DETECTED NOT DETECTED Final    Comment: (NOTE) The GeneXpert MRSA Assay (FDA approved for NASAL specimens only), is one component of a comprehensive MRSA colonization surveillance program. It is not intended to diagnose MRSA infection nor to guide or monitor treatment for MRSA infections. Test performance is not FDA approved in patients less than 44  years old. Performed at [facility], [address], [place], [place] [address]     Anti-infectives:  Anti-infectives (From admission, onward)    Start     Dose/Rate Route Frequency Ordered Stop   06/08/22 1100  cefoTEtan (CEFOTAN) 2 g in sodium chloride 0.9 % 100 mL IVPB        2 g 200 mL/hr over 30 Minutes Intravenous  Once 06/08/22 0916 06/08/22 1115   06/07/22 0630  cefoTEtan (CEFOTAN) 2 g in sodium chloride 0.9 % 100 mL IVPB        2 g 200 mL/hr over 30 Minutes Intravenous  Once 06/07/22 0629 06/07/22 0825      Subjective:    Overnight Issues:   Objective:  Vital signs for last 24 hours: Temp:  [98.6 F (37 C)-102.4 F (39.1 C)] 98.7 F (37.1 C) (08/01 0714) Pulse Rate:  [74-189] 77 (08/01 0700) Resp:  [15-34] 28 (08/01 0700) BP: (92-124)/(54-82) 104/66 (08/01 0700) SpO2:  [92 %-100 %] 100 % (08/01 0700) FiO2 (%):  [40 %] 40 % (07/31 1537)  Hemodynamic parameters for last 24 hours:    Intake/Output from previous day: 07/31 0701 - 08/01 0700 In: 5659.4 [I.V.:4059.6; IV Piggyback:1599.8] Out: 1960 [Urine:1450; Emesis/NG output:200; Drains:300; Blood:10]  Intake/Output this shift: No intake/output data recorded.  Vent settings for last 24 hours: Vent Mode: PRVC FiO2 (%):  [40 %] 40 % Set Rate:  [16 bmp] 16 bmp Vt Set:  [630 mL] 630 mL PEEP:  [5 cmH20] 5 cmH20 Plateau Pressure:  [19 cmH20-20 cmH20] 20 cmH20  Physical Exam:  General: alert and no respiratory distress Neuro: alert and oriented HEENT/Neck: no JVD  Resp: clear to auscultation bilaterally CVS: RRR GI: dressing OK, soft, distended Extremities: calvfes soft  Results for orders placed or performed during the hospital encounter of 06/07/22 (from the past 24 hour(s))  Glucose, capillary     Status: None   Collection Time: 06/08/22 10:57 AM  Result Value Ref Range   Glucose-Capillary 92 70 - 99 mg/dL  Glucose, capillary     Status: Abnormal   Collection Time: 06/08/22 12:14 PM  Result  Value Ref Range   Glucose-Capillary 69 (L) 70 - 99 mg/dL  Glucose, capillary     Status: None   Collection Time: 06/08/22 12:38 PM  Result Value Ref Range   Glucose-Capillary 96 70 - 99 mg/dL  Glucose, capillary     Status: Abnormal   Collection Time: 06/08/22  3:44 PM  Result Value Ref Range   Glucose-Capillary 57 (L) 70 - 99 mg/dL  Glucose, capillary     Status: None   Collection Time: 06/08/22  4:06 PM  Result Value Ref Range   Glucose-Capillary 98 70 - 99 mg/dL  Glucose, capillary     Status: Abnormal   Collection Time: 06/08/22  7:01 PM  Result Value Ref Range   Glucose-Capillary 114 (H) 70 - 99 mg/dL  Glucose, capillary     Status: Abnormal   Collection Time: 06/08/22 11:10 PM  Result Value Ref Range   Glucose-Capillary 112 (H) 70 - 99 mg/dL  CBC     Status: Abnormal   Collection Time: 06/09/22 12:44 AM  Result Value Ref Range   WBC 11.8 (H) 4.0 - 10.5 K/uL   RBC 4.41 4.22 - 5.81 MIL/uL   Hemoglobin 12.5 (L) 13.0 - 17.0 g/dL   HCT 36.6 (L) 39.0 - 52.0 %   MCV 83.0 80.0 - 100.0 fL   MCH 28.3 26.0 - 34.0 pg   MCHC 34.2 30.0 - 36.0 g/dL   RDW 12.6 11.5 - 15.5 %   Platelets 223 150 - 400 K/uL   nRBC 0.0 0.0 - 0.2 %  Basic metabolic panel     Status: Abnormal   Collection Time: 06/09/22 12:44 AM  Result Value Ref Range   Sodium 137 135 - 145 mmol/L   Potassium 3.9 3.5 - 5.1 mmol/L   Chloride 106 98 - 111 mmol/L   CO2 26 22 - 32 mmol/L   Glucose, Bld 117 (H) 70 - 99 mg/dL   BUN 8 6 - 20 mg/dL   Creatinine, Ser 0.97 0.61 - 1.24 mg/dL   Calcium 7.7 (L) 8.9 - 10.3 mg/dL   GFR, Estimated >60 >60 mL/min   Anion gap 5 5 - 15  Glucose, capillary     Status: Abnormal   Collection Time: 06/09/22  3:04 AM  Result Value Ref Range   Glucose-Capillary 111 (H) 70 - 99 mg/dL  Glucose, capillary     Status: Abnormal   Collection Time: 06/09/22  7:12 AM  Result Value Ref Range   Glucose-Capillary 102 (H) 70 - 99 mg/dL    Assessment & Plan: Present on Admission:  Abdominal  pain  Internal hernia    LOS: 2 days   Additional comments:I reviewed the patient's new clinical lab test results. / SBO from internal hernia  Status post diagnostic laparoscopy, lysis of adhesions, reduction of internal hernia, and placement of ABThera VAC 7/30 by Dr. Kieth Brightly  S/P re-exploration, appendectomy, resection of sac, and closure 7/31 by Dr. Grandville Silos - AROBF, NGT to LIWS Acute hypoxic ventilator dependent respiratory failure - extubated  7/31, wean [place] O2 as able FEN - NGT, hypoglycemia - D5 1/2 NS at 150, did require D50 yest VTE - LMWH  Dispo - ICU, wean Dex, add scheduled IV Robaxin and Ofirmev, PT Critical Care Total Time*: 35 Minutes  Violeta Gelinas, MD, MPH, FACS Trauma & General Surgery Use [email or web address] to contact on call provider  06/09/2022  *Care during the described time interval was provided by me. I have reviewed this patient's available data, including medical history, events of note, physical examination and test results as part of my evaluation.

## 2022-06-10 LAB — CBC
HCT: 36.9 % — ABNORMAL LOW (ref 39.0–52.0)
Hemoglobin: 12.6 g/dL — ABNORMAL LOW (ref 13.0–17.0)
MCH: 28.7 pg (ref 26.0–34.0)
MCHC: 34.1 g/dL (ref 30.0–36.0)
MCV: 84.1 fL (ref 80.0–100.0)
Platelets: 245 10*3/uL (ref 150–400)
RBC: 4.39 MIL/uL (ref 4.22–5.81)
RDW: 12.5 % (ref 11.5–15.5)
WBC: 11.3 10*3/uL — ABNORMAL HIGH (ref 4.0–10.5)
nRBC: 0 % (ref 0.0–0.2)

## 2022-06-10 LAB — BASIC METABOLIC PANEL
Anion gap: 8 (ref 5–15)
BUN: 8 mg/dL (ref 6–20)
CO2: 22 mmol/L (ref 22–32)
Calcium: 7.8 mg/dL — ABNORMAL LOW (ref 8.9–10.3)
Chloride: 103 mmol/L (ref 98–111)
Creatinine, Ser: 1.01 mg/dL (ref 0.61–1.24)
GFR, Estimated: >60 mL/min (ref >60)
Glucose, Bld: 113 mg/dL — ABNORMAL HIGH (ref 70–99)
Potassium: 3.4 mmol/L — ABNORMAL LOW (ref 3.5–5.1)
Sodium: 133 mmol/L — ABNORMAL LOW (ref 135–145)

## 2022-06-10 LAB — GLUCOSE, CAPILLARY
Glucose-Capillary: 100 mg/dL — ABNORMAL HIGH (ref 70–99)
Glucose-Capillary: 100 mg/dL — ABNORMAL HIGH (ref 70–99)
Glucose-Capillary: 111 mg/dL — ABNORMAL HIGH (ref 70–99)
Glucose-Capillary: 87 mg/dL (ref 70–99)
Glucose-Capillary: 94 mg/dL (ref 70–99)

## 2022-06-10 MED ORDER — OXYCODONE HCL 5 MG/5ML PO SOLN
10.0000 mg | Freq: Four times a day (QID) | ORAL | Status: DC
  Administered 2022-06-10 – 2022-06-11 (×3): 10 mg
  Filled 2022-06-10 (×3): qty 10

## 2022-06-10 MED ORDER — FENTANYL CITRATE PF 50 MCG/ML IJ SOSY
50.0000 ug | PREFILLED_SYRINGE | INTRAMUSCULAR | Status: DC | PRN
  Administered 2022-06-10 – 2022-06-11 (×7): 100 ug via INTRAVENOUS
  Filled 2022-06-10 (×7): qty 2

## 2022-06-10 MED ORDER — PHENOL 1.4 % MT LIQD
1.0000 | OROMUCOSAL | Status: DC | PRN
  Administered 2022-06-12: 1 via OROMUCOSAL
  Filled 2022-06-10: qty 177

## 2022-06-10 MED ORDER — POTASSIUM CHLORIDE 10 MEQ/100ML IV SOLN
10.0000 meq | INTRAVENOUS | Status: AC
  Administered 2022-06-10 (×6): 10 meq via INTRAVENOUS
  Filled 2022-06-10 (×6): qty 100

## 2022-06-10 MED ORDER — ACETAMINOPHEN 160 MG/5ML PO SOLN
650.0000 mg | Freq: Four times a day (QID) | ORAL | Status: DC
Start: 2022-06-10 — End: 2022-06-11
  Administered 2022-06-10 – 2022-06-11 (×3): 650 mg
  Filled 2022-06-10 (×3): qty 20.3

## 2022-06-10 NOTE — Progress Notes (Signed)
Patient brought to 4E from 30M. VSS. Telemetry box applied, CCMD notified. Patient states 10/10 pain scale abdomen. Pain medication given. Patient oriented to room and staff. Call bell in reach.  Kenard Gower, RN    06/10/22 1434  Vitals  Temp 97.8 F (36.6 C)  Temp Source Oral  BP (!) 143/93  MAP (mmHg) 105  BP Location Left Arm  BP Method Automatic  Patient Position (if appropriate) Lying  Pulse Rate 98  Pulse Rate Source Monitor  ECG Heart Rate 98  Level of Consciousness  Level of Consciousness Alert  MEWS COLOR  MEWS Score Color Green  Oxygen Therapy  SpO2 99 %  O2 Device Room Air  Pain Assessment  Pain Scale 0-10  Pain Score 10  Pain Type Acute pain  Pain Location Abdomen  Pain Orientation Medial  Pain Descriptors / Indicators Aching  Pain Frequency Constant  Pain Onset On-going  Patients Stated Pain Goal 2  Pain Intervention(s) Medication (See eMAR)  Height and Weight  Height 6\' 1"  (1.854 m)  Weight 78.5 kg  BSA (Calculated - sq m) 2.01 sq meters  BMI (Calculated) 22.84  Weight in (lb) to have BMI = 25 189.1  MEWS Score  MEWS Temp 0  MEWS Systolic 0  MEWS Pulse 0  MEWS RR 1  MEWS LOC 0  MEWS Score 1

## 2022-06-10 NOTE — Progress Notes (Signed)
OT Cancellation Note  Patient Details Name: Kartik Fernando MRN: [id number] DOB: 2001-03-15   Cancelled Treatment:    Reason Eval/Treat Not Completed: Pain limiting ability to participate;Medical issues which prohibited therapy. Per RN, pt is getting ready to transfer to 4E. RN requesting hold for now due to pt increased pain with pending transfer. OT will follow as pt is ready.   Dolorez Jeffrey Elane Kelvin Burpee 06/10/2022, 2:01 PM

## 2022-06-10 NOTE — Progress Notes (Signed)
Patient ID: Kenneth Farley, male   DOB: [birth year], 21 y.o.   MRN: [id number] 2 Days Post-Op    Subjective: No flatus, a lot of incisional pain ROS negative except as listed above. Objective: Vital signs in last 24 hours: Temp:  [98.3 F (36.8 C)-99.6 F (37.6 C)] 98.3 F (36.8 C) (08/02 0726) Pulse Rate:  [74-103] 91 (08/02 0400) Resp:  [19-31] 19 (08/02 0400) BP: (100-142)/(56-83) 130/79 (08/02 0400) SpO2:  [96 %-100 %] 100 % (08/02 0400) Last BM Date :  (PTA)  Intake/Output from previous day: 08/01 0701 - 08/02 0700 In: 4726.8 [I.V.:3504; IV Piggyback:1222.7] Out: 1900 [Urine:1600; Emesis/NG output:300] Intake/Output this shift: No intake/output data recorded.  General appearance: alert and cooperative Resp: clear to auscultation bilaterally Cardio: regular rate and rhythm GI: soft but distended, appropriate tenderness Extremities: claves sfot Neurologic: Mental status: Alert, oriented, thought content appropriate  Lab Results: CBC  Recent Labs    06/09/22 0044 06/10/22 0113  WBC 11.8* 11.3*  HGB 12.5* 12.6*  HCT 36.6* 36.9*  PLT 223 245   BMET Recent Labs    06/09/22 0044 06/10/22 0113  NA 137 133*  K 3.9 3.4*  CL 106 103  CO2 26 22  GLUCOSE 117* 113*  BUN 8 8  CREATININE 0.97 1.01  CALCIUM 7.7* 7.8*   PT/INR No results for input(s): "LABPROT", "INR" in the last 72 hours. ABG No results for input(s): "PHART", "HCO3" in the last 72 hours.  Invalid input(s): "PCO2", "PO2"  Studies/Results: No results found.  Anti-infectives: Anti-infectives (From admission, onward)    Start     Dose/Rate Route Frequency Ordered Stop   06/08/22 1100  cefoTEtan (CEFOTAN) 2 g in sodium chloride 0.9 % 100 mL IVPB        2 g 200 mL/hr over 30 Minutes Intravenous  Once 06/08/22 0916 06/08/22 1115   06/07/22 0630  cefoTEtan (CEFOTAN) 2 g in sodium chloride 0.9 % 100 mL IVPB        2 g 200 mL/hr over 30 Minutes Intravenous  Once 06/07/22 0354 06/07/22 0825        Assessment/Plan: SBO from internal hernia  Status post diagnostic laparoscopy, lysis of adhesions, reduction of internal hernia, and placement of ABThera VAC 7/30 by Dr. Sheliah Hatch  S/P re-exploration, appendectomy, resection of sac, and closure 7/31 by Dr. Janee Morn - AROBF, NGT to LIWS Acute hypoxic respiratory failure - extubated 7/31, wean [place] O2 as able FEN - NGT, hypoglycemia - better on D5 1/2 NS, decrease IVF, replete hypokalemia VTE - LMWH  Dispo - to 4NP or 4E, start oxy and tylenol per tube. PT/mobilize   LOS: 3 days    Violeta Gelinas, MD, MPH, FACS Trauma & General Surgery Use [email or web address] to contact on call provider  06/10/2022

## 2022-06-10 NOTE — Progress Notes (Signed)
PT Cancellation Note  Patient Details Name: Kenneth Farley MRN: [id number] DOB: 07/18/2001   Cancelled Treatment:    Reason Eval/Treat Not Completed: Patient declined, no reason specified.  Per RN, pt is getting ready to transfer to 4E. RN requesting hold for now due to pt increased pain with pending transfer. PT will follow as pt is ready.  06/10/2022  Jacinto Halim., PT Acute Rehabilitation Services [phone]  (pager) [phone]  (office)   [name] 06/10/2022, 2:14 PM

## 2022-06-11 LAB — GLUCOSE, CAPILLARY
Glucose-Capillary: 107 mg/dL — ABNORMAL HIGH (ref 70–99)
Glucose-Capillary: 119 mg/dL — ABNORMAL HIGH (ref 70–99)
Glucose-Capillary: 106 mg/dL — ABNORMAL HIGH (ref 70–99)
Glucose-Capillary: 114 mg/dL — ABNORMAL HIGH (ref 70–99)
Glucose-Capillary: 103 mg/dL — ABNORMAL HIGH (ref 70–99)

## 2022-06-11 LAB — BASIC METABOLIC PANEL
Anion gap: 9 (ref 5–15)
BUN: 8 mg/dL (ref 6–20)
CO2: 27 mmol/L (ref 22–32)
Calcium: 8.3 mg/dL — ABNORMAL LOW (ref 8.9–10.3)
Chloride: 98 mmol/L (ref 98–111)
Creatinine, Ser: 0.88 mg/dL (ref 0.61–1.24)
GFR, Estimated: >60 mL/min (ref >60)
Glucose, Bld: 125 mg/dL — ABNORMAL HIGH (ref 70–99)
Potassium: 3.6 mmol/L (ref 3.5–5.1)
Sodium: 134 mmol/L — ABNORMAL LOW (ref 135–145)

## 2022-06-11 LAB — CBC
HCT: 35.3 % — ABNORMAL LOW (ref 39.0–52.0)
Hemoglobin: 12.6 g/dL — ABNORMAL LOW (ref 13.0–17.0)
MCH: 28.7 pg (ref 26.0–34.0)
MCHC: 35.7 g/dL (ref 30.0–36.0)
MCV: 80.4 fL (ref 80.0–100.0)
Platelets: 293 10*3/uL (ref 150–400)
RBC: 4.39 MIL/uL (ref 4.22–5.81)
RDW: 12 % (ref 11.5–15.5)
WBC: 7.3 10*3/uL (ref 4.0–10.5)
nRBC: 0 % (ref 0.0–0.2)

## 2022-06-11 LAB — TRIGLYCERIDES: Triglycerides: 160 mg/dL — ABNORMAL HIGH (ref <150)

## 2022-06-11 MED ORDER — METHOCARBAMOL 1000 MG/10ML IJ SOLN
1000.0000 mg | Freq: Four times a day (QID) | INTRAVENOUS | Status: DC
  Administered 2022-06-11 – 2022-06-13 (×6): 1000 mg via INTRAVENOUS
  Filled 2022-06-11 (×2): qty 10
  Filled 2022-06-11: qty 1000
  Filled 2022-06-11 (×5): qty 10

## 2022-06-11 MED ORDER — KETOROLAC TROMETHAMINE 15 MG/ML IJ SOLN
15.0000 mg | Freq: Four times a day (QID) | INTRAMUSCULAR | Status: DC
  Administered 2022-06-11 – 2022-06-14 (×12): 15 mg via INTRAVENOUS
  Filled 2022-06-11 (×12): qty 1

## 2022-06-11 MED ORDER — ACETAMINOPHEN 160 MG/5ML PO SOLN
1000.0000 mg | Freq: Four times a day (QID) | ORAL | Status: DC
  Administered 2022-06-11 – 2022-06-12 (×3): 1000 mg
  Filled 2022-06-11 (×4): qty 40.6

## 2022-06-11 MED ORDER — OXYCODONE HCL 5 MG/5ML PO SOLN: 10.0000 mg | Freq: Four times a day (QID) | ORAL | Status: DC | PRN

## 2022-06-11 MED ORDER — LIDOCAINE 5 % EX PTCH
1.0000 | MEDICATED_PATCH | Freq: Every day | CUTANEOUS | Status: DC
  Administered 2022-06-11 – 2022-06-14 (×4): 1 via TRANSDERMAL
  Filled 2022-06-11 (×4): qty 1

## 2022-06-11 MED ORDER — FENTANYL CITRATE PF 50 MCG/ML IJ SOSY
50.0000 ug | PREFILLED_SYRINGE | INTRAMUSCULAR | Status: DC | PRN
  Administered 2022-06-11 – 2022-06-13 (×12): 100 ug via INTRAVENOUS
  Filled 2022-06-11 (×14): qty 2

## 2022-06-11 NOTE — Progress Notes (Signed)
Patient ID: Kenneth Farley, male   DOB: 01/02/2001, 21 y.o.   MRN: [id number] 3 Days Post-Op    Subjective: Reports pain as the same. Says he got up to [facility] and passed flatus but no BM. Voiding with condom cath in place.  ROS negative except as listed above. Objective: Vital signs in last 24 hours: Temp:  [97.8 F (36.6 C)-99.5 F (37.5 C)] 99.3 F (37.4 C) (08/03 0311) Pulse Rate:  [84-100] 100 (08/03 0311) Resp:  [18-28] 22 (08/03 0311) BP: (132-153)/(78-97) 137/78 (08/03 0311) SpO2:  [94 %-100 %] 94 % (08/03 0311) Weight:  [78.5 kg] 78.5 kg (08/02 1434) Last BM Date :  (PTA)  Intake/Output from previous day: 08/02 0701 - 08/03 0700 In: 2761.3 [I.V.:1964.5; NG/GT:120; IV Piggyback:676.8] Out: 3450 [Urine:2400; Emesis/NG output:1050] Intake/Output this shift: No intake/output data recorded.  Gen: alert, NAD, laying in bed Pulm: normal effort on room air - O2 sats 88-89%, increased to 90-91% with deep inspiration Abd: soft, mild distention, appropriately tender, small amt sanguinous strikethrough on honeycomb. NG in place w/ bilious drainage (1,050 cc/24h) Psych: A&Ox3 Neuro: non-focal exam, gait not assessed    Lab Results: CBC  Recent Labs    06/10/22 0113 06/11/22 0704  WBC 11.3* 7.3  HGB 12.6* 12.6*  HCT 36.9* 35.3*  PLT 245 293   BMET Recent Labs    06/10/22 0113 06/11/22 0704  NA 133* 134*  K 3.4* 3.6  CL 103 98  CO2 22 27  GLUCOSE 113* 125*  BUN 8 8  CREATININE 1.01 0.88  CALCIUM 7.8* 8.3*   PT/INR No results for input(s): "LABPROT", "INR" in the last 72 hours. ABG No results for input(s): "PHART", "HCO3" in the last 72 hours.  Invalid input(s): "PCO2", "PO2"  Studies/Results: No results found.  Anti-infectives: Anti-infectives (From admission, onward)    Start     Dose/Rate Route Frequency Ordered Stop   06/08/22 1100  cefoTEtan (CEFOTAN) 2 g in sodium chloride 0.9 % 100 mL IVPB        2 g 200 mL/hr over 30 Minutes Intravenous  Once  06/08/22 0916 06/08/22 1115   06/07/22 0630  cefoTEtan (CEFOTAN) 2 g in sodium chloride 0.9 % 100 mL IVPB        2 g 200 mL/hr over 30 Minutes Intravenous  Once 06/07/22 9622 06/07/22 0825       Assessment/Plan: SBO from internal hernia  Status post diagnostic laparoscopy, lysis of adhesions, reduction of internal hernia, and placement of ABThera VAC 7/30 by Dr. Sheliah Hatch   S/P re-exploration, appendectomy, resection of sac, and closure 7/31 by Dr. Janee Morn - AROBF, NGT to LIWS Acute hypoxic respiratory failure - extubated 7/31, wean [place] O2 as able FEN - NGT, hypoglycemia - better on D5 1/2 NS, decrease IVF, replete hypokalemia VTE - LMWH  Dispo - 4E, remove condom cath and void w/ urinal or in bathroom, encourage IS and place back on O2 if sats stay below 90%, OOB and mobilize with PT   increase tylenol and robaxin, add lidoderm, start toradol 15 mg q6h, decrease fentanyl to q3h PRN  This plan of care was discussed with the patients nurse.    LOS: 4 days  Hosie Spangle, PA-C Trauma & General Surgery Use [email or web address] to contact on call provider  06/11/2022

## 2022-06-11 NOTE — Evaluation (Signed)
Physical Therapy Evaluation Patient Details Name: Lexx Monte MRN: [id number] DOB: [birth year] Today's Date: 06/11/2022  History of Present Illness  Pt is a 21 y.o. male admitted 06/07/22 with abdominal pain. S/p diagnostic laparoscopy, lysis of adhesions, reduction of internal hernia, placement of abdominal temporary closure device on 7/30. Return to OR 7/31 for re-exlap, hernia sac removal, appendectomy, abdominal closure. ETT 7/30-7/31. No PMH on file.   Clinical Impression  Pt presents with an overall decrease in functional mobility secondary to above. PTA, pt independent, works, plays basketball, lives with 66 month old daughter and her mother. Initiated educ re: abdominal precautions, positioning, activity recommendations, and importance of mobility. Today, pt able to transfer and ambulate well, stability improving with distance, assist for line/NGT management. Pt would benefit from continued acute PT services to maximize functional mobility and independence prior to d/c home.      SpO2 93% on RA, HR 103   Recommendations for follow up therapy are one component of a multi-disciplinary discharge planning process, led by the attending physician.  Recommendations may be updated based on patient status, additional functional criteria and insurance authorization.  Follow Up Recommendations No PT follow up      Assistance Recommended at Discharge Set up Supervision/Assistance  Patient can return home with the following  A little help with bathing/dressing/bathroom;Assistance with cooking/housework;Assist for transportation    Equipment Recommendations None recommended by PT  Recommendations for Other Services       Functional Status Assessment Patient has had a recent decline in their functional status and demonstrates the ability to make significant improvements in function in a reasonable and predictable amount of time.     Precautions / Restrictions Precautions Precautions:  Fall;Other (comment) Precaution Comments: abdominal precautions, NGT Restrictions Weight Bearing Restrictions: No      Mobility  Bed Mobility Overal bed mobility: Modified Independent             General bed mobility comments: educ on log roll technique, HOB slightly elevated    Transfers Overall transfer level: Needs assistance Equipment used: None Transfers: Sit to/from Stand Sit to Stand: Supervision           General transfer comment: able to stand from EOB and low toilet height without assist, supevision for safety/lines    Ambulation/Gait Ambulation/Gait assistance: Min guard, Supervision Gait Distance (Feet): 490 Feet Assistive device: None, IV Pole Gait Pattern/deviations: Step-through pattern, Decreased stride length, Trunk flexed Gait velocity: Decreased     General Gait Details: slow, guarded gait with and without pushing IV pole; initial self-corrected LOB within first few steps, progressing to supervision for safety/lines, stability significantly improving with distance  Stairs            Wheelchair Mobility    Modified Rankin (Stroke Patients Only)       Balance Overall balance assessment: Needs assistance Sitting-balance support: No upper extremity supported Sitting balance-Leahy Scale: Good Sitting balance - Comments: able to partially don L sock, limited due to abdominal pain; assist provided   Standing balance support: No upper extremity supported, During functional activity Standing balance-Leahy Scale: Good Standing balance comment: initially fair progressing to good balance                             Pertinent Vitals/Pain Pain Assessment Pain Assessment: Faces Faces Pain Scale: Hurts little more Pain Location: abdomen Pain Descriptors / Indicators: Discomfort Pain Intervention(s): Monitored during session, Patient requesting pain meds-RN notified  Home Living Family/patient expects to be discharged to::  Private residence Living Arrangements: Spouse/significant other Available Help at Discharge: Family Type of Home: Apartment Home Access: Level entry     Alternate Level Stairs-Number of Steps: flight Home Layout: Two level;Able to live on main level with bedroom/bathroom (bedroom upstairs, couch on main level) Home Equipment: None Additional Comments: Lives with 73 month old daughter and her "baby mama"; pt's mom, dad and brother live in same neighborhood, availabel to assist as needed    Prior Function Prior Level of Function : Independent/Modified Independent;Driving             Mobility Comments: independent without DME, works at [employer], enjoys playing basketball       Higher education careers adviser        Extremity/Trunk Assessment   Upper Extremity Assessment Upper Extremity Assessment: Overall WFL for tasks assessed    Lower Extremity Assessment Lower Extremity Assessment: Overall WFL for tasks assessed    Cervical / Trunk Assessment Cervical / Trunk Assessment: Other exceptions Cervical / Trunk Exceptions: abdominal sx  Communication   Communication: No difficulties  Cognition Arousal/Alertness: Awake/alert Behavior During Therapy: WFL for tasks assessed/performed Overall Cognitive Status: Within Functional Limits for tasks assessed                                          General Comments General comments (skin integrity, edema, etc.): felt need to have BM after walk, but still only having gas. educ on abdominal precautions for comfort, activity recommendations, importance of mobility    Exercises     Assessment/Plan    PT Assessment Patient needs continued PT services  PT Problem List Decreased balance;Decreased mobility;Pain       PT Treatment Interventions DME instruction;Gait training;Stair training;Functional mobility training;Therapeutic activities;Therapeutic exercise;Balance training;Patient/family education    PT Goals (Current goals can be  found in the Care Plan section)  Acute Rehab PT Goals Patient Stated Goal: return home PT Goal Formulation: With patient Time For Goal Achievement: 06/25/22 Potential to Achieve Goals: Good    Frequency Min 3X/week     Co-evaluation               AM-PAC PT "6 Clicks" Mobility  Outcome Measure Help needed turning from your back to your side while in a flat bed without using bedrails?: None Help needed moving from lying on your back to sitting on the side of a flat bed without using bedrails?: None Help needed moving to and from a bed to a chair (including a wheelchair)?: A Little Help needed standing up from a chair using your arms (e.g., wheelchair or bedside chair)?: A Little Help needed to walk in hospital room?: A Little Help needed climbing 3-5 steps with a railing? : A Little 6 Click Score: 20    End of Session Equipment Utilized During Treatment: Gait belt Activity Tolerance: Patient tolerated treatment well Patient left: in chair;with call bell/phone within reach Nurse Communication: Mobility status;Patient requests pain meds PT Visit Diagnosis: Other abnormalities of gait and mobility (R26.89);Pain    Time: 0930-1000 PT Time Calculation (min) (ACUTE ONLY): 30 min   Charges:   PT Evaluation $PT Eval Low Complexity: 1 Low        Ina Homes, PT, DPT Acute Rehabilitation Services  Personal: [employer] [facility] Rehab Office: [phone]  Malachy Chamber 06/11/2022, 10:17 AM

## 2022-06-11 NOTE — Evaluation (Signed)
Occupational Therapy Evaluation Patient Details Name: Kenneth Farley MRN: [id number] DOB: 2001-03-28 Today's Date: 06/11/2022   History of Present Illness Pt is a 21 y.o. male admitted 06/07/22 with abdominal pain. S/p diagnostic laparoscopy, lysis of adhesions, reduction of internal hernia, placement of abdominal temporary closure device on 7/30. Return to OR 7/31 for re-exlap, hernia sac removal, appendectomy, abdominal closure. ETT 7/30-7/31. No PMH on file.   Clinical Impression   Pt ambulating in hall independently with his Mom. Educated on compensatory strategies for ADL tasks. No further OT needs. Signing off.  Mom asked about community resources due to hardship/being out of work. Relayed message to CM.      Recommendations for follow up therapy are one component of a multi-disciplinary discharge planning process, led by the attending physician.  Recommendations may be updated based on patient status, additional functional criteria and insurance authorization.   Follow Up Recommendations  No OT follow up    Assistance Recommended at Discharge Intermittent Supervision/Assistance  Patient can return home with the following A little help with bathing/dressing/bathroom;Assistance with cooking/housework;Assist for transportation    Functional Status Assessment  Patient has had a recent decline in their functional status and demonstrates the ability to make significant improvements in function in a reasonable and predictable amount of time.  Equipment Recommendations  None recommended by OT    Recommendations for Other Services       Precautions / Restrictions Precautions Precautions: Other (comment) Precaution Comments: abdominal precautions, NGT Restrictions Weight Bearing Restrictions: No      Mobility Bed Mobility Overal bed mobility: Modified Independent             General bed mobility comments: educ on log roll technique, use of pillow to "splin" abdomen if  needed    Transfers Overall transfer level: Modified independent                        Balance Overall balance assessment: Needs assistance Sitting-balance support: No upper extremity supported Sitting balance-Leahy Scale: Good Sitting balance - Comments: able to partially don L sock, limited due to abdominal pain; assist provided   Standing balance support: No upper extremity supported, During functional activity Standing balance-Leahy Scale: Good                             ADL either performed or assessed with clinical judgement   ADL                                         General ADL Comments: Educated pt/Mom on compensatory strategies for ADL tasks; able to doon socks using figure four position educated on options on holding his baby by placing her on pillows on abdomen to protect abdomen and to avoid lifting at this time     Vision         Perception     Praxis      Pertinent Vitals/Pain Pain Assessment Pain Assessment: Faces Faces Pain Scale: Hurts little more Pain Location: abdomen Pain Descriptors / Indicators: Discomfort Pain Intervention(s): Limited activity within patient's tolerance     Hand Dominance Right   Extremity/Trunk Assessment Upper Extremity Assessment Upper Extremity Assessment: Overall WFL for tasks assessed   Lower Extremity Assessment Lower Extremity Assessment: Overall WFL for tasks assessed   Cervical / Trunk Assessment Cervical /  Trunk Assessment: Other exceptions Cervical / Trunk Exceptions: abdominal sx   Communication Communication Communication: No difficulties   Cognition Arousal/Alertness: Awake/alert Behavior During Therapy: WFL for tasks assessed/performed Overall Cognitive Status: Within Functional Limits for tasks assessed                                       General Comments  VSS    Exercises     Shoulder Instructions      Home Living Family/patient  expects to be discharged to:: Private residence Living Arrangements: Spouse/significant other Available Help at Discharge: Family Type of Home: Apartment Home Access: Level entry     Home Layout: Two level;Able to live on main level with bedroom/bathroom (bedroom upstairs, couch on main level) Alternate Level Stairs-Number of Steps: flight   Bathroom Shower/Tub: Chief Strategy Officer: Standard Bathroom Accessibility: Yes How Accessible: Accessible via walker Home Equipment: None   Additional Comments: Lives with 53 month old daughter and her "baby mama"; pt's mom, dad and brother live in same neighborhood, available to assist as needed      Prior Functioning/Environment Prior Level of Function : Independent/Modified Independent;Driving             Mobility Comments: independent without DME, works at [employer], enjoys playing basketball          OT Problem List: Pain      OT Treatment/Interventions:      OT Goals(Current goals can be found in the care plan section) Acute Rehab OT Goals Patient Stated Goal: to go home to his baby OT Goal Formulation: All assessment and education complete, DC therapy  OT Frequency:      Co-evaluation              AM-PAC OT "6 Clicks" Daily Activity     Outcome Measure Help from another person eating meals?: None (NG) Help from another person taking care of personal grooming?: None Help from another person toileting, which includes using toliet, bedpan, or urinal?: None Help from another person bathing (including washing, rinsing, drying)?: None Help from another person to put on and taking off regular upper body clothing?: None Help from another person to put on and taking off regular lower body clothing?: None 6 Click Score: 24   End of Session Nurse Communication: Other (comment) (NG tube needing to be secured)  Activity Tolerance: Patient tolerated treatment well Patient left: in bed;with family/visitor  present;with call bell/phone within reach  OT Visit Diagnosis: Pain                Time: 1350-1410 OT Time Calculation (min): 20 min Charges:  OT General Charges $OT Visit: 1 Visit OT Evaluation $OT Eval Low Complexity: 1 Low  Luisa Dago, OT/L   Acute OT Clinical Specialist Acute Rehabilitation Services Pager [phone] Office [phone]   [facility] 06/11/2022, 2:26 PM

## 2022-06-12 LAB — GLUCOSE, CAPILLARY
Glucose-Capillary: 101 mg/dL — ABNORMAL HIGH (ref 70–99)
Glucose-Capillary: 103 mg/dL — ABNORMAL HIGH (ref 70–99)
Glucose-Capillary: 110 mg/dL — ABNORMAL HIGH (ref 70–99)
Glucose-Capillary: 110 mg/dL — ABNORMAL HIGH (ref 70–99)
Glucose-Capillary: 122 mg/dL — ABNORMAL HIGH (ref 70–99)
Glucose-Capillary: 139 mg/dL — ABNORMAL HIGH (ref 70–99)
Glucose-Capillary: 96 mg/dL (ref 70–99)

## 2022-06-12 LAB — BASIC METABOLIC PANEL
Anion gap: 8 (ref 5–15)
BUN: 11 mg/dL (ref 6–20)
CO2: 25 mmol/L (ref 22–32)
Calcium: 8.1 mg/dL — ABNORMAL LOW (ref 8.9–10.3)
Chloride: 100 mmol/L (ref 98–111)
Creatinine, Ser: 0.89 mg/dL (ref 0.61–1.24)
GFR, Estimated: >60 mL/min (ref >60)
Glucose, Bld: 115 mg/dL — ABNORMAL HIGH (ref 70–99)
Potassium: 3.6 mmol/L (ref 3.5–5.1)
Sodium: 133 mmol/L — ABNORMAL LOW (ref 135–145)

## 2022-06-12 MED ORDER — ACETAMINOPHEN 325 MG PO TABS
650.0000 mg | ORAL_TABLET | Freq: Four times a day (QID) | ORAL | Status: DC | PRN
  Administered 2022-06-12: 650 mg via ORAL
  Filled 2022-06-12: qty 2

## 2022-06-12 MED ORDER — ACETAMINOPHEN 650 MG RE SUPP: 650.0000 mg | Freq: Four times a day (QID) | RECTAL | Status: DC | PRN

## 2022-06-12 MED ORDER — ACETAMINOPHEN 160 MG/5ML PO SOLN
1000.0000 mg | Freq: Four times a day (QID) | ORAL | Status: DC
  Administered 2022-06-13 – 2022-06-14 (×3): 1000 mg via ORAL
  Filled 2022-06-12 (×6): qty 40.6

## 2022-06-12 NOTE — Progress Notes (Signed)
Patient ID: Kenneth Farley, male   DOB: 2001/05/16, 21 y.o.   MRN: [id number] 4 Days Post-Op    Subjective: Passed gas and had a BM ROS negative except as listed above. Objective: Vital signs in last 24 hours: Temp:  [98.1 F (36.7 C)-101.2 F (38.4 C)] 98.2 F (36.8 C) (08/04 0820) Pulse Rate:  [79-98] 79 (08/04 0820) Resp:  [17-20] 20 (08/04 0820) BP: (133-147)/(74-93) 139/85 (08/04 0820) SpO2:  [93 %-99 %] 99 % (08/04 0820) Last BM Date :  (PTA)  Intake/Output from previous day: 08/03 0701 - 08/04 0700 In: -  Out: 750 [Emesis/NG output:750] Intake/Output this shift: No intake/output data recorded.  General appearance: cooperative Resp: clear to auscultation bilaterally Cardio: regular rate and rhythm GI: soft, incision CDI, port site CDI  Lab Results: CBC  Recent Labs    06/10/22 0113 06/11/22 0704  WBC 11.3* 7.3  HGB 12.6* 12.6*  HCT 36.9* 35.3*  PLT 245 293   BMET Recent Labs    06/11/22 0704 06/12/22 0318  NA 134* 133*  K 3.6 3.6  CL 98 100  CO2 27 25  GLUCOSE 125* 115*  BUN 8 11  CREATININE 0.88 0.89  CALCIUM 8.3* 8.1*   PT/INR No results for input(s): "LABPROT", "INR" in the last 72 hours. ABG No results for input(s): "PHART", "HCO3" in the last 72 hours.  Invalid input(s): "PCO2", "PO2"  Studies/Results: No results found.  Anti-infectives: Anti-infectives (From admission, onward)    Start     Dose/Rate Route Frequency Ordered Stop   06/08/22 1100  cefoTEtan (CEFOTAN) 2 g in sodium chloride 0.9 % 100 mL IVPB        2 g 200 mL/hr over 30 Minutes Intravenous  Once 06/08/22 0916 06/08/22 1115   06/07/22 0630  cefoTEtan (CEFOTAN) 2 g in sodium chloride 0.9 % 100 mL IVPB        2 g 200 mL/hr over 30 Minutes Intravenous  Once 06/07/22 2409 06/07/22 0825       Assessment/Plan: SBO from internal hernia  Status post diagnostic laparoscopy, lysis of adhesions, reduction of internal hernia, and placement of ABThera VAC 7/30 by Dr. Sheliah Hatch    S/P re-exploration, appendectomy, resection of sac, and closure 7/31 by Dr. Janee Morn - +BM, D/Cd NGT, clears Acute hypoxic respiratory failure - extubated 7/31, now on RA FEN - clears, decrease IVF VTE - LMWH  Dispo - 4E, doing well with therapies. Pain med adjustment has helped as well  LOS: 5 days    Violeta Gelinas, MD, MPH, FACS Trauma & General Surgery Use [email or web address] to contact on call provider  06/12/2022

## 2022-06-12 NOTE — Progress Notes (Addendum)
Notified pharmacy through [facility] need Robaxin at [phone]. Notified pharmacy again through [phone] at [phone] for this medication need.Notified need through [facility] once again. Awaiting medication.  Kenard Gower, RN

## 2022-06-12 NOTE — Progress Notes (Signed)
Physical Therapy Treatment & Discharge Patient Details Name: Kenneth Farley MRN: [id number] DOB: 2001-04-08 Today's Date: 06/12/2022   History of Present Illness Pt is a 21 y.o. male admitted 06/07/22 with abdominal pain. S/p diagnostic laparoscopy, lysis of adhesions, reduction of internal hernia, placement of abdominal temporary closure device on 7/30. Return to OR 7/31 for re-exlap, hernia sac removal, appendectomy, abdominal closure. ETT 7/30-7/31. No PMH on file.   PT Comments    Pt progressing with mobility; mod indep with transfers, ambulation and stair training. Reviewed education, including abdominal precautions and activity recommendations. Pt has met short-term acute goals, reports no further questions or concerns. Will d/c acute PT.     Recommendations for follow up therapy are one component of a multi-disciplinary discharge planning process, led by the attending physician.  Recommendations may be updated based on patient status, additional functional criteria and insurance authorization.  Follow Up Recommendations  No PT follow up     Assistance Recommended at Discharge Set up Supervision/Assistance  Patient can return home with the following A little help with bathing/dressing/bathroom;Assistance with cooking/housework;Assist for transportation   Equipment Recommendations  None recommended by PT    Recommendations for Other Services       Precautions / Restrictions Precautions Precautions: Other (comment) Precaution Comments: abdominal precautions, NGT Restrictions Weight Bearing Restrictions: No     Mobility  Bed Mobility Overal bed mobility: Independent                  Transfers Overall transfer level: Independent Equipment used: None Transfers: Sit to/from Stand                  Ambulation/Gait Ambulation/Gait assistance: Nurse, learning disability (Feet): 400 Feet Assistive device: None, IV Pole Gait Pattern/deviations: Step-through  pattern, Decreased stride length       General Gait Details: slow, steady gait indep with and without pushing IV pole; improved upright posture   Stairs Stairs: Yes Stairs assistance: Modified independent (Device/Increase time) Stair Management: No rails, One rail Right, Forwards, Alternating pattern Number of Stairs: 4     Wheelchair Mobility    Modified Rankin (Stroke Patients Only)       Balance Overall balance assessment: Needs assistance Sitting-balance support: No upper extremity supported Sitting balance-Leahy Scale: Good     Standing balance support: No upper extremity supported, During functional activity Standing balance-Leahy Scale: Good Standing balance comment: able to don bilateral socks from standing position without UE support                            Cognition Arousal/Alertness: Awake/alert Behavior During Therapy: WFL for tasks assessed/performed Overall Cognitive Status: Within Functional Limits for tasks assessed                                          Exercises      General Comments General comments (skin integrity, edema, etc.): noted IV leaking, RN notified      Pertinent Vitals/Pain Pain Assessment Pain Assessment: Faces Faces Pain Scale: Hurts a little bit Pain Location: NGT > abdominal incision Pain Descriptors / Indicators: Discomfort Pain Intervention(s): Monitored during session    Home Living                          Prior Function  PT Goals (current goals can now be found in the care plan section) Progress towards PT goals: Goals met/education completed, patient discharged from PT    Frequency    Min 3X/week      PT Plan Current plan remains appropriate    Co-evaluation              AM-PAC PT "6 Clicks" Mobility   Outcome Measure  Help needed turning from your back to your side while in a flat bed without using bedrails?: None Help needed moving from  lying on your back to sitting on the side of a flat bed without using bedrails?: None Help needed moving to and from a bed to a chair (including a wheelchair)?: None Help needed standing up from a chair using your arms (e.g., wheelchair or bedside chair)?: None Help needed to walk in hospital room?: None Help needed climbing 3-5 steps with a railing? : None 6 Click Score: 24    End of Session   Activity Tolerance: Patient tolerated treatment well Patient left: in bed;with call bell/phone within reach Nurse Communication: Mobility status;Other (comment) (IV leaking, paused) PT Visit Diagnosis: Other abnormalities of gait and mobility (R26.89);Pain     Time: 2423-5361 PT Time Calculation (min) (ACUTE ONLY): 22 min  Charges:  $Gait Training: 8-22 mins                     Mabeline Caras, PT, DPT Acute Rehabilitation Services  Personal: [place] Office: [place] 06/12/2022, 10:36 AM

## 2022-06-13 LAB — GLUCOSE, CAPILLARY
Glucose-Capillary: 99 mg/dL (ref 70–99)
Glucose-Capillary: 105 mg/dL — ABNORMAL HIGH (ref 70–99)
Glucose-Capillary: 122 mg/dL — ABNORMAL HIGH (ref 70–99)
Glucose-Capillary: 96 mg/dL (ref 70–99)
Glucose-Capillary: 108 mg/dL — ABNORMAL HIGH (ref 70–99)

## 2022-06-13 LAB — BASIC METABOLIC PANEL
Anion gap: 6 (ref 5–15)
BUN: 10 mg/dL (ref 6–20)
CO2: 29 mmol/L (ref 22–32)
Calcium: 8.3 mg/dL — ABNORMAL LOW (ref 8.9–10.3)
Chloride: 99 mmol/L (ref 98–111)
Creatinine, Ser: 0.88 mg/dL (ref 0.61–1.24)
GFR, Estimated: >60 mL/min (ref >60)
Glucose, Bld: 94 mg/dL (ref 70–99)
Potassium: 3.6 mmol/L (ref 3.5–5.1)
Sodium: 134 mmol/L — ABNORMAL LOW (ref 135–145)

## 2022-06-13 MED ORDER — METHOCARBAMOL 500 MG PO TABS
1000.0000 mg | ORAL_TABLET | Freq: Four times a day (QID) | ORAL | Status: DC
  Administered 2022-06-13 – 2022-06-14 (×4): 1000 mg via ORAL
  Filled 2022-06-13 (×4): qty 2

## 2022-06-13 MED ORDER — FENTANYL CITRATE PF 50 MCG/ML IJ SOSY
50.0000 ug | PREFILLED_SYRINGE | Freq: Three times a day (TID) | INTRAMUSCULAR | Status: DC | PRN
  Administered 2022-06-13: 100 ug via INTRAVENOUS

## 2022-06-13 NOTE — Progress Notes (Signed)
Patient ID: Kenneth Farley, male   DOB: [birth year], 21 y.o.   MRN: [id number] 5 Days Post-Op    Subjective: Passed gas and had a BM. Tolerating CLD. Says he ate some solid food yesterday  Still taking fentanyl 7x/day. Not taking oxy at all. Discussed need to wean IV pain meds with patient.   ROS negative except as listed above. Objective: Vital signs in last 24 hours: Temp:  [97.9 F (36.6 C)-98.6 F (37 C)] 98.1 F (36.7 C) (08/05 0738) Pulse Rate:  [73-84] 74 (08/05 0738) Resp:  [18-20] 20 (08/05 0738) BP: (133-142)/(75-92) 142/88 (08/05 0738) SpO2:  [97 %-100 %] 100 % (08/05 0738) Last BM Date : 06/12/22  Intake/Output from previous day: 08/04 0701 - 08/05 0700 In: 3719.1 [P.O.:720; I.V.:2449.1; IV Piggyback:550] Out: 401 [Urine:400; Stool:1] Intake/Output this shift: No intake/output data recorded.  General appearance: cooperative Resp: clear to auscultation bilaterally Cardio: regular rate and rhythm GI: soft, incision CDI, port site CDI  Lab Results: CBC  Recent Labs    06/11/22 0704  WBC 7.3  HGB 12.6*  HCT 35.3*  PLT 293   BMET Recent Labs    06/12/22 0318 06/13/22 0200  NA 133* 134*  K 3.6 3.6  CL 100 99  CO2 25 29  GLUCOSE 115* 94  BUN 11 10  CREATININE 0.89 0.88  CALCIUM 8.1* 8.3*     Anti-infectives: Anti-infectives (From admission, onward)    Start     Dose/Rate Route Frequency Ordered Stop   06/08/22 1100  cefoTEtan (CEFOTAN) 2 g in sodium chloride 0.9 % 100 mL IVPB        2 g 200 mL/hr over 30 Minutes Intravenous  Once 06/08/22 0916 06/08/22 1115   06/07/22 0630  cefoTEtan (CEFOTAN) 2 g in sodium chloride 0.9 % 100 mL IVPB        2 g 200 mL/hr over 30 Minutes Intravenous  Once 06/07/22 1017 06/07/22 0825       Assessment/Plan: SBO from internal hernia  Status post diagnostic laparoscopy, lysis of adhesions, reduction of internal hernia, and placement of ABThera VAC 7/30 by Dr. Sheliah Hatch   S/P re-exploration, appendectomy,  resection of sac, and closure 7/31 by Dr. Janee Morn - +BM, D/Cd NGT 8/4. Tolerating CLD, advance to SOFT.   Acute hypoxic respiratory failure - extubated 7/31, now on RA FEN - soft , stop  IVF VTE - LMWH  Dispo - 4E, advance diet, change robaxin to PO - encourage oxy for pain and decrease fentanyl   LOS: 6 days    Hosie Spangle, PA-C  Trauma & General Surgery Use [email or web address] to contact on call provider  06/13/2022

## 2022-06-14 LAB — GLUCOSE, CAPILLARY
Glucose-Capillary: 101 mg/dL — ABNORMAL HIGH (ref 70–99)
Glucose-Capillary: 100 mg/dL — ABNORMAL HIGH (ref 70–99)
Glucose-Capillary: 87 mg/dL (ref 70–99)

## 2022-06-14 LAB — TRIGLYCERIDES: Triglycerides: 157 mg/dL — ABNORMAL HIGH (ref <150)

## 2022-06-14 LAB — CREATININE, SERUM
Creatinine, Ser: 0.92 mg/dL (ref 0.61–1.24)
GFR, Estimated: >60 mL/min (ref >60)

## 2022-06-14 MED ORDER — IBUPROFEN 200 MG PO TABS: 400.0000 mg | ORAL_TABLET | Freq: Three times a day (TID) | ORAL | Status: AC | PRN

## 2022-06-14 MED ORDER — POLYETHYLENE GLYCOL 3350 17 G PO PACK
17.0000 g | PACK | Freq: Every day | ORAL | 0 refills | Status: AC | PRN
Start: 2022-06-14 — End: ?

## 2022-06-14 MED ORDER — ACETAMINOPHEN 500 MG PO TABS: 1000.0000 mg | ORAL_TABLET | Freq: Four times a day (QID) | ORAL | 2 refills | Status: AC | PRN

## 2022-06-14 MED ORDER — METHOCARBAMOL 1000 MG PO TABS: 1000.0000 mg | ORAL_TABLET | Freq: Four times a day (QID) | ORAL | 0 refills | Status: AC | PRN

## 2022-06-14 MED ORDER — OXYCODONE HCL 10 MG PO TABS: 10.0000 mg | ORAL_TABLET | Freq: Four times a day (QID) | ORAL | 0 refills | Status: DC | PRN

## 2022-06-14 NOTE — Plan of Care (Signed)

## 2022-06-26 NOTE — Discharge Summary (Signed)
[facility] Discharge Summary   Patient ID: Kenneth Farley MRN: [id number] DOB/AGE: [birth year] 21 y.o.  Admit date: 06/07/2022 Discharge date: 06/14/2022  Admitting Diagnosis: SBO  Discharge Diagnosis Patient Active Problem List   Diagnosis Date Noted   Abdominal pain 06/07/2022   Internal hernia 06/07/2022    Consultants None   Imaging: No results found.  Procedures Status post diagnostic laparoscopy, lysis of adhesions, reduction of internal hernia, and placement of ABThera VAC 7/30 by Dr. Sheliah Hatch    S/P re-exploration, appendectomy, resection of sac, and closure 7/31 by Dr. Janee Morn   HPI:  Kenneth Farley is an 21 y.o. male who is here for worsening abdominal pain.  He states his abdominal pain started 2 days ago on Friday and it is gotten progressively worse.  He had nausea and vomiting but no fever or chills.  Has had a bowel movement.  No prior abdominal surgery.  Had a similar episode about a year ago but not nearly as intense.  No trouble urinating.  No blood in urine.  Denies any chronic medical conditions.  History is somewhat challenging to obtain due to patient writhing in abdominal pain.    Hospital Course:   Patient has a remote history of kidney stones so a noncontrasted CT was performed.  No stone was identified but the patient had evidence of an internal hernia causing bowel obstruction. Patient was taken emergently for the operating room for the above surgery admitted and underwent procedure listed above.  Tolerated procedure well and was transferred to the ICU.  Taken back for a second look laparotomy as above on 7/31. Extubated on POD#0. NGT tube remained in place until return of bowel function. Diet was advanced as tolerated.  On POD#6, the patient was voiding well, tolerating diet, ambulating well, pain well controlled, vital signs stable, incisions c/d/i and felt stable for discharge home.  Patient will follow up in our office as below and knows to  call with questions or concerns.    I have personally reviewed the patients medication history on the [place] controlled substance database.   Physical Exam: General:  Alert, NAD, pleasant, comfortable Abd:  Soft, ND, incisions c/d/I, appropriately tender   Allergies as of 06/14/2022   No Known Allergies      Medication List     STOP taking these medications    cyclobenzaprine 10 MG tablet Commonly known as: FLEXERIL   ketorolac 10 MG tablet Commonly known as: TORADOL       TAKE these medications    acetaminophen 500 MG tablet Commonly known as: TYLENOL Take 2 tablets (1,000 mg total) by mouth every 6 (six) hours as needed.   ibuprofen 200 MG tablet Commonly known as: ADVIL Take 2 tablets (400 mg total) by mouth every 8 (eight) hours as needed for mild pain or moderate pain.   Methocarbamol 1000 MG Tabs Take 1,000 mg by mouth every 6 (six) hours as needed for muscle spasms.   Oxycodone HCl 10 MG Tabs Take 1 tablet (10 mg total) by mouth every 6 (six) hours as needed for moderate pain, severe pain or breakthrough pain (pain not releived by tylenol, ibuprofen, robaxin).   polyethylene glycol 17 g packet Commonly known as: MIRALAX / GLYCOLAX Take 17 g by mouth daily as needed for mild constipation.          Follow-up Information     [facility] Follow up on 06/22/2022.   Specialty: General Surgery Why: our office is scheduling you  for staple removal on 06/22/2022 please call to confirm appointment date/time. Contact information: [address] [place] [address] [phone]        Kinsinger, De Blanch, MD Follow up.   Specialty: General Surgery Why: our office is scheduling you for post-operative follow up. please call to confirm appointment date/time. Contact information: [address] [place] [address] [phone]                 Signed: Hosie Spangle, [facility] 06/26/2022, 1:54 PM

## 2022-07-01 NOTE — Progress Notes (Signed)
Acute appendicitis and periappendicitis and Mesothelial lined fibrovascular soft tissue with hemorrhage evidence of acute serositis from hernia sac is not a clinically significant diagnosis

## 2022-07-15 ENCOUNTER — Encounter: Payer: Self-pay | Admitting: Nurse Practitioner

## 2022-07-15 ENCOUNTER — Ambulatory Visit (INDEPENDENT_AMBULATORY_CARE_PROVIDER_SITE_OTHER): Payer: 59 | Admitting: Nurse Practitioner

## 2022-07-15 VITALS — BP 127/90 | HR 76 | Temp 97.7°F | Ht 71.5 in | Wt 156.6 lb

## 2022-07-15 DIAGNOSIS — F419 Anxiety disorder, unspecified: Secondary | ICD-10-CM

## 2022-07-15 DIAGNOSIS — M545 Low back pain, unspecified: Secondary | ICD-10-CM

## 2022-07-15 DIAGNOSIS — Z0000 Encounter for general adult medical examination without abnormal findings: Secondary | ICD-10-CM | POA: Diagnosis not present

## 2022-07-15 LAB — POCT URINALYSIS DIP (CLINITEK)
Bilirubin, UA: NEGATIVE
Blood, UA: NEGATIVE
Glucose, UA: NEGATIVE mg/dL
Ketones, POC UA: NEGATIVE mg/dL
Leukocytes, UA: NEGATIVE
Nitrite, UA: NEGATIVE
POC PROTEIN,UA: NEGATIVE
Spec Grav, UA: 1.025 (ref 1.010–1.025)
Urobilinogen, UA: 4 E.U./dL — AB
pH, UA: 6 (ref 5.0–8.0)

## 2022-07-15 MED ORDER — TIZANIDINE HCL 4 MG PO TABS: 4.0000 mg | ORAL_TABLET | Freq: Four times a day (QID) | ORAL | 0 refills | Status: DC | PRN

## 2022-07-15 MED ORDER — FLUOXETINE HCL 10 MG PO TABS: 10.0000 mg | ORAL_TABLET | Freq: Every day | ORAL | 0 refills | Status: DC

## 2022-07-15 NOTE — Patient Instructions (Addendum)
1. Routine health maintenance  - CBC - Comprehensive metabolic panel - Lipid Panel - TSH - POCT URINALYSIS DIP (CLINITEK)  2. Anxiety  - FLUoxetine (PROZAC) 10 MG tablet; Take 1 tablet (10 mg total) by mouth daily.  Dispense: 30 tablet; Refill: 0   3. Acute bilateral low back pain without sciatica  - tiZANidine (ZANAFLEX) 4 MG tablet; Take 1 tablet (4 mg total) by mouth every 6 (six) hours as needed for muscle spasms.  Dispense: 30 tablet; Refill: 0   Follow up:  Follow up in 3 months or sooner if needed

## 2022-07-15 NOTE — Progress Notes (Signed)
@Patient  ID: Kenneth Farley, male    DOB: September 26, 2001, 21 y.o.   MRN: [id number]  Chief Complaint  Patient presents with   Establish Care    Pt is here to establish care. Pt states he has anxiety he is fidgeting, hard to focus/ concentrate. Pt also has lower back pain    Referring provider: No ref. provider found  Recent significant events:  Hospital admission:06/07/2022 Discharge date: 06/14/2022  Procedures Status post diagnostic laparoscopy, lysis of adhesions, reduction of internal hernia, and placement of ABThera VAC 7/30 by Dr. Kieth Brightly    S/P re-exploration, appendectomy, resection of sac, and closure 7/31 by Dr. Doylene Canard Course:   Patient has a remote history of kidney stones so a noncontrasted CT was performed.  No stone was identified but the patient had evidence of an internal hernia causing bowel obstruction. Patient was taken emergently for the operating room for the above surgery admitted and underwent procedure listed above.  Tolerated procedure well and was transferred to the ICU.  Taken back for a second look laparotomy as above on 7/31. Extubated on POD#0. NGT tube remained in place until return of bowel function. Diet was advanced as tolerated.  On POD#6, the patient was voiding well, tolerating diet, ambulating well, pain well controlled, vital signs stable, incisions c/d/i and felt stable for discharge home.     HPI  21 year old with recent history of bowel obstruction (see notes above). Here for hospital follow up and to establish care.  Overall patient has been doing well since hospital discharge.  He did start back work yesterday.  He has followed up with surgery.  He denies any new issues or concerns related to his hospitalization.  Patient does complain today of anxiety that he has had for quite some time.  He states that this has worsened since having surgery.  He would like to start on medication for this.  Patient also complains today of back pain.   He states that this has been present for a few weeks now. He denies any injury to his back. He has not been doing any heavy lifting.   Denies f/c/s, n/v/d, hemoptysis, PND, leg swelling Denies chest pain or edema     No Known Allergies   There is no immunization history on file for this patient.  Past Medical History:  Diagnosis Date   Medical history non-contributory     Tobacco History: Social History   Tobacco Use  Smoking Status Never  Smokeless Tobacco Never   Counseling given: Not Answered   Outpatient Encounter Medications as of 07/15/2022  Medication Sig   FLUoxetine (PROZAC) 10 MG tablet Take 1 tablet (10 mg total) by mouth daily.   methocarbamol 1000 MG TABS Take 1,000 mg by mouth every 6 (six) hours as needed for muscle spasms.   tiZANidine (ZANAFLEX) 4 MG tablet Take 1 tablet (4 mg total) by mouth every 6 (six) hours as needed for muscle spasms.   acetaminophen (TYLENOL) 500 MG tablet Take 2 tablets (1,000 mg total) by mouth every 6 (six) hours as needed. (Patient not taking: Reported on 07/15/2022)   ibuprofen (ADVIL) 200 MG tablet Take 2 tablets (400 mg total) by mouth every 8 (eight) hours as needed for mild pain or moderate pain. (Patient not taking: Reported on 07/15/2022)   oxyCODONE 10 MG TABS Take 1 tablet (10 mg total) by mouth every 6 (six) hours as needed for moderate pain, severe pain or breakthrough pain (pain not releived by  tylenol, ibuprofen, robaxin). (Patient not taking: Reported on 07/15/2022)   polyethylene glycol (MIRALAX / GLYCOLAX) 17 g packet Take 17 g by mouth daily as needed for mild constipation. (Patient not taking: Reported on 07/15/2022)   No facility-administered encounter medications on file as of 07/15/2022.     Review of Systems  Review of Systems  Constitutional: Negative.   HENT: Negative.    Cardiovascular: Negative.   Gastrointestinal: Negative.   Musculoskeletal:  Positive for back pain.  Allergic/Immunologic: Negative.    Neurological: Negative.   Psychiatric/Behavioral:  The patient is nervous/anxious.        Physical Exam  BP (!) 127/90 (BP Location: Right Arm, Patient Position: Sitting, Cuff Size: Normal)   Pulse 76   Temp 97.7 F (36.5 C)   Ht 5' 11.5" (1.816 m)   Wt 156 lb 9.6 oz (71 kg)   SpO2 100%   BMI 21.54 kg/m   Wt Readings from Last 5 Encounters:  07/15/22 156 lb 9.6 oz (71 kg)  06/10/22 173 lb 1 oz (78.5 kg)  12/11/20 160 lb (72.6 kg)  09/28/16 159 lb 9.6 oz (72.4 kg) (79 %, Z= 0.80)*  04/17/13 125 lb 14.1 oz (57.1 kg) (86 %, Z= 1.06)*   * Growth percentiles are based on CDC (Boys, 2-20 Years) data.     Physical Exam Vitals and nursing note reviewed.  Constitutional:      General: He is not in acute distress.    Appearance: He is well-developed.  Cardiovascular:     Rate and Rhythm: Normal rate and regular rhythm.  Pulmonary:     Effort: Pulmonary effort is normal.     Breath sounds: Normal breath sounds.  Skin:    General: Skin is warm and dry.  Neurological:     Mental Status: He is alert and oriented to person, place, and time.      Lab Results:  CBC    Component Value Date/Time   WBC 4.8 07/15/2022 1606   WBC 7.3 06/11/2022 0704   RBC 5.03 07/15/2022 1606   RBC 4.39 06/11/2022 0704   HGB 14.4 07/15/2022 1606   HCT 42.4 07/15/2022 1606   PLT 244 07/15/2022 1606   MCV 84 07/15/2022 1606   MCH 28.6 07/15/2022 1606   MCH 28.7 06/11/2022 0704   MCHC 34.0 07/15/2022 1606   MCHC 35.7 06/11/2022 0704   RDW 13.7 07/15/2022 1606   LYMPHSABS 0.9 03/07/2021 1514   MONOABS 0.6 03/07/2021 1514   EOSABS 0.0 03/07/2021 1514   BASOSABS 0.0 03/07/2021 1514    BMET    Component Value Date/Time   NA 141 07/15/2022 1606   K 4.0 07/15/2022 1606   CL 103 07/15/2022 1606   CO2 24 07/15/2022 1606   GLUCOSE 83 07/15/2022 1606   GLUCOSE 94 06/13/2022 0200   BUN 17 07/15/2022 1606   CREATININE 1.09 07/15/2022 1606   CALCIUM 9.5 07/15/2022 1606   GFRNONAA >60  06/14/2022 0651    BNP No results found for: "BNP"  ProBNP No results found for: "PROBNP"  Imaging: No results found.   Assessment & Plan:   Routine health maintenance - CBC - Comprehensive metabolic panel - Lipid Panel - TSH - POCT URINALYSIS DIP (CLINITEK)  2. Anxiety  - FLUoxetine (PROZAC) 10 MG tablet; Take 1 tablet (10 mg total) by mouth daily.  Dispense: 30 tablet; Refill: 0   3. Acute bilateral low back pain without sciatica  - tiZANidine (ZANAFLEX) 4 MG tablet; Take 1 tablet (4  mg total) by mouth every 6 (six) hours as needed for muscle spasms.  Dispense: 30 tablet; Refill: 0   Follow up:  Follow up in 3 months or sooner if needed     Ivonne Andrew, NP 07/16/2022

## 2022-07-16 DIAGNOSIS — Z0000 Encounter for general adult medical examination without abnormal findings: Secondary | ICD-10-CM | POA: Insufficient documentation

## 2022-07-16 LAB — CBC
Hematocrit: 42.4 % (ref 37.5–51.0)
Hemoglobin: 14.4 g/dL (ref 13.0–17.7)
MCH: 28.6 pg (ref 26.6–33.0)
MCHC: 34 g/dL (ref 31.5–35.7)
MCV: 84 fL (ref 79–97)
Platelets: 244 10*3/uL (ref 150–450)
RBC: 5.03 x10E6/uL (ref 4.14–5.80)
RDW: 13.7 % (ref 11.6–15.4)
WBC: 4.8 10*3/uL (ref 3.4–10.8)

## 2022-07-16 LAB — LIPID PANEL
Chol/HDL Ratio: 3.1 ratio (ref 0.0–5.0)
Cholesterol, Total: 136 mg/dL (ref 100–199)
HDL: 44 mg/dL (ref >39)
LDL Chol Calc (NIH): 79 mg/dL (ref 0–99)
Triglycerides: 60 mg/dL (ref 0–149)
VLDL Cholesterol Cal: 13 mg/dL (ref 5–40)

## 2022-07-16 LAB — COMPREHENSIVE METABOLIC PANEL
ALT: 21 IU/L (ref 0–44)
AST: 21 IU/L (ref 0–40)
Albumin/Globulin Ratio: 1.7 (ref 1.2–2.2)
Albumin: 4.6 g/dL (ref 4.3–5.2)
Alkaline Phosphatase: 129 IU/L — ABNORMAL HIGH (ref 44–121)
BUN/Creatinine Ratio: 16 (ref 9–20)
BUN: 17 mg/dL (ref 6–20)
Bilirubin Total: 0.6 mg/dL (ref 0.0–1.2)
CO2: 24 mmol/L (ref 20–29)
Calcium: 9.5 mg/dL (ref 8.7–10.2)
Chloride: 103 mmol/L (ref 96–106)
Creatinine, Ser: 1.09 mg/dL (ref 0.76–1.27)
Globulin, Total: 2.7 g/dL (ref 1.5–4.5)
Glucose: 83 mg/dL (ref 70–99)
Potassium: 4 mmol/L (ref 3.5–5.2)
Sodium: 141 mmol/L (ref 134–144)
Total Protein: 7.3 g/dL (ref 6.0–8.5)
eGFR: 98 mL/min/{1.73_m2} (ref >59)

## 2022-07-16 LAB — TSH: TSH: 0.912 u[IU]/mL (ref 0.450–4.500)

## 2022-07-16 NOTE — Assessment & Plan Note (Signed)
-   CBC - Comprehensive metabolic panel - Lipid Panel - TSH - POCT URINALYSIS DIP (CLINITEK)  2. Anxiety  - FLUoxetine (PROZAC) 10 MG tablet; Take 1 tablet (10 mg total) by mouth daily.  Dispense: 30 tablet; Refill: 0   3. Acute bilateral low back pain without sciatica  - tiZANidine (ZANAFLEX) 4 MG tablet; Take 1 tablet (4 mg total) by mouth every 6 (six) hours as needed for muscle spasms.  Dispense: 30 tablet; Refill: 0   Follow up:  Follow up in 3 months or sooner if needed

## 2022-08-05 ENCOUNTER — Telehealth: Payer: Self-pay | Admitting: Clinical

## 2022-08-05 NOTE — Telephone Encounter (Signed)
[facility] Case Management Referral Note  08/05/2022 Name: Kenneth Farley MRN: [id number] DOB: December 30, 2000 Kenneth Farley is a 21 y.o. year old male who sees Fenton Foy, NP for primary care. LCSW was consulted to assess patient's needs and assist the patient with Mental Health Counseling and Resources.  **This was an unsuccessful encounter.**  Interpreter: No.   Interpreter Name & Language: none  Assessment: Patient experiencing Mental Health Concerns  - anxiety.  Intervention: CSW called patient on 9/15, 9/25, and today 08/05/22 to follow up. Patient did not answer and CSW left voice mails at each attempt. CSW available from clinic for support if patient reaches out.  Review of patient status, including review of consultants reports, relevant laboratory and other test results, and collaboration with appropriate care team members and the patient's provider was performed as part of comprehensive patient evaluation and provision of services.    Estanislado Emms, [facility] [phone]

## 2022-08-12 ENCOUNTER — Ambulatory Visit: Payer: Self-pay | Admitting: Nurse Practitioner

## 2022-08-28 ENCOUNTER — Ambulatory Visit (INDEPENDENT_AMBULATORY_CARE_PROVIDER_SITE_OTHER): Payer: 59 | Admitting: Nurse Practitioner

## 2022-08-28 ENCOUNTER — Encounter: Payer: Self-pay | Admitting: Nurse Practitioner

## 2022-08-28 VITALS — BP 127/80 | HR 60 | Ht 72.0 in | Wt 164.6 lb

## 2022-08-28 DIAGNOSIS — Z09 Encounter for follow-up examination after completed treatment for conditions other than malignant neoplasm: Secondary | ICD-10-CM | POA: Diagnosis not present

## 2022-08-28 NOTE — Progress Notes (Signed)
Pain no  Safe yes    Fall no  [phone]

## 2022-08-28 NOTE — Patient Instructions (Addendum)
1. Status post abdominal surgery, follow-up exam  Incision site healing well  Stay well hydrated  Stay active  Follow up:  Follow up if needed

## 2022-08-28 NOTE — Assessment & Plan Note (Signed)
Incision site healing well  Stay well hydrated  Stay active  Follow up:  Follow up if needed

## 2022-08-28 NOTE — Progress Notes (Signed)
@Patient  ID: Kenneth Farley, male    DOB: [birth year], 21 y.o.   MRN: [id number]  Chief Complaint  Patient presents with   Follow-up    Referring provider: Fenton Foy, NP   HPI  21 year old male with history of appendectomy  Patient presents today for follow-up visit.  Overall he has been doing well.  He has followed with his surgeon post appendectomy.  No new issues or concerns today. Denies f/c/s, n/v/d, hemoptysis, PND, leg swelling Denies chest pain or edema     No Known Allergies   There is no immunization history on file for this patient.  Past Medical History:  Diagnosis Date   Medical history non-contributory     Tobacco History: Social History   Tobacco Use  Smoking Status Never  Smokeless Tobacco Never   Counseling given: Not Answered   Outpatient Encounter Medications as of 08/28/2022  Medication Sig   acetaminophen (TYLENOL) 500 MG tablet Take 2 tablets (1,000 mg total) by mouth every 6 (six) hours as needed.   ibuprofen (ADVIL) 200 MG tablet Take 2 tablets (400 mg total) by mouth every 8 (eight) hours as needed for mild pain or moderate pain.   methocarbamol 1000 MG TABS Take 1,000 mg by mouth every 6 (six) hours as needed for muscle spasms.   oxyCODONE 10 MG TABS Take 1 tablet (10 mg total) by mouth every 6 (six) hours as needed for moderate pain, severe pain or breakthrough pain (pain not releived by tylenol, ibuprofen, robaxin).   polyethylene glycol (MIRALAX / GLYCOLAX) 17 g packet Take 17 g by mouth daily as needed for mild constipation.   tiZANidine (ZANAFLEX) 4 MG tablet Take 1 tablet (4 mg total) by mouth every 6 (six) hours as needed for muscle spasms.   FLUoxetine (PROZAC) 10 MG tablet Take 1 tablet (10 mg total) by mouth daily.   No facility-administered encounter medications on file as of 08/28/2022.     Review of Systems  Review of Systems     Physical Exam  BP 127/80   Pulse 60   Ht 6' (1.829 m)   Wt 164 lb 9.6 oz  (74.7 kg)   SpO2 98%   BMI 22.32 kg/m   Wt Readings from Last 5 Encounters:  08/28/22 164 lb 9.6 oz (74.7 kg)  07/15/22 156 lb 9.6 oz (71 kg)  06/10/22 173 lb 1 oz (78.5 kg)  12/11/20 160 lb (72.6 kg)  09/28/16 159 lb 9.6 oz (72.4 kg) (79 %, Z= 0.80)*   * Growth percentiles are based on CDC (Boys, 2-20 Years) data.     Physical Exam   Lab Results:  CBC    Component Value Date/Time   WBC 4.8 07/15/2022 1606   WBC 7.3 06/11/2022 0704   RBC 5.03 07/15/2022 1606   RBC 4.39 06/11/2022 0704   HGB 14.4 07/15/2022 1606   HCT 42.4 07/15/2022 1606   PLT 244 07/15/2022 1606   MCV 84 07/15/2022 1606   MCH 28.6 07/15/2022 1606   MCH 28.7 06/11/2022 0704   MCHC 34.0 07/15/2022 1606   MCHC 35.7 06/11/2022 0704   RDW 13.7 07/15/2022 1606   LYMPHSABS 0.9 03/07/2021 1514   MONOABS 0.6 03/07/2021 1514   EOSABS 0.0 03/07/2021 1514   BASOSABS 0.0 03/07/2021 1514    BMET    Component Value Date/Time   NA 141 07/15/2022 1606   K 4.0 07/15/2022 1606   CL 103 07/15/2022 1606   CO2 24 07/15/2022 1606   GLUCOSE  83 07/15/2022 1606   GLUCOSE 94 06/13/2022 0200   BUN 17 07/15/2022 1606   CREATININE 1.09 07/15/2022 1606   CALCIUM 9.5 07/15/2022 1606   GFRNONAA >60 06/14/2022 0651    BNP No results found for: "BNP"  ProBNP No results found for: "PROBNP"  Imaging: No results found.   Assessment & Plan:   Status post abdominal surgery, follow-up exam Incision site healing well  Stay well hydrated  Stay active  Follow up:  Follow up if needed     Ivonne Andrew, NP 08/28/2022

## 2022-09-03 IMAGING — CT CT ABD-PELV W/ CM
2 of 4 series · 16 of 46 positions shown, 18 images · IV contrast (APPLIED)
Comparison: None.

CLINICAL DATA: Nonlocalized abdominal pain.

EXAM:
CT ABDOMEN AND PELVIS WITH CONTRAST
TECHNIQUE: Multidetector CT imaging of the abdomen and pelvis was performed
using the standard protocol following bolus administration of
intravenous contrast.
CONTRAST:  100mL OMNIPAQUE IOHEXOL 300 MG/ML  SOLN

[Series 3: abd/ pelvis 5.0 i30f 2 (person_name) · axial · 0.69mm/px · z∈[+878,+1268]mm · 13 of 86 slices shown, 15 images]
[im 4/86  soft-tissue]
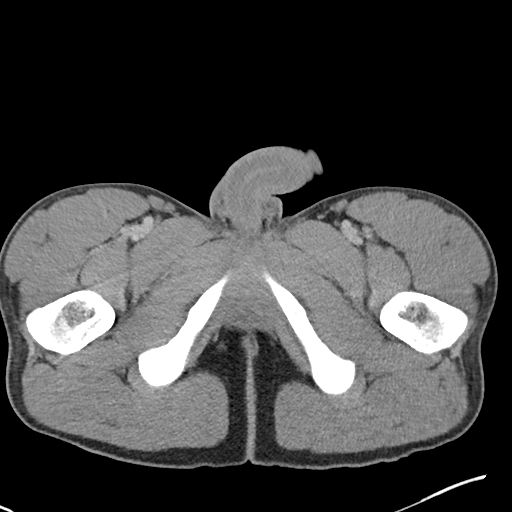
[im 4/86  bone]
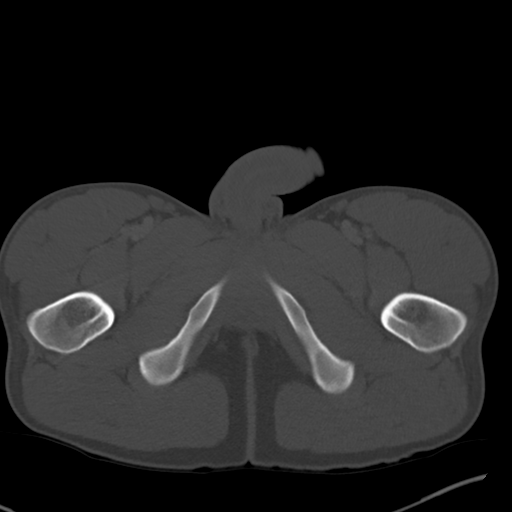
[im 11/86  soft-tissue]
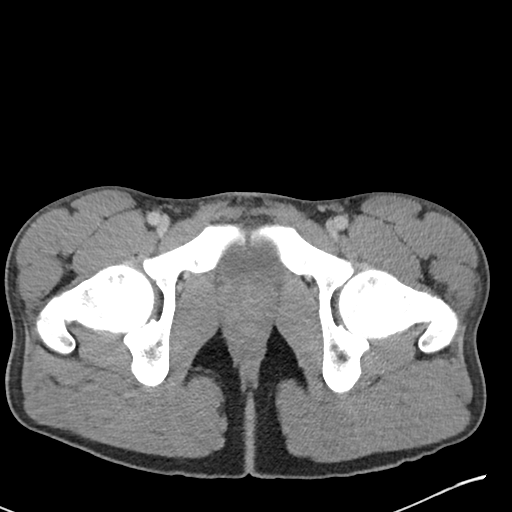
[im 18/86  soft-tissue]
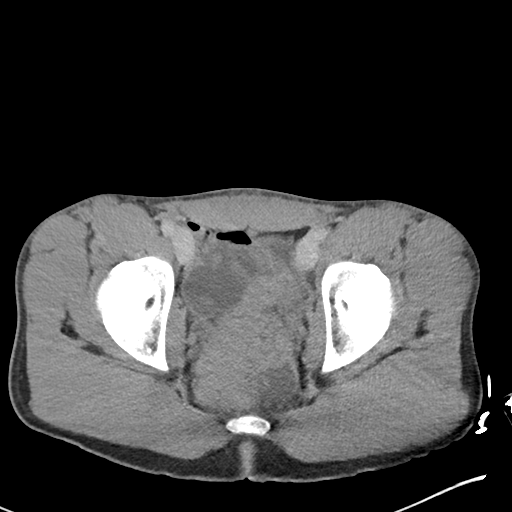
[im 24/86  soft-tissue]
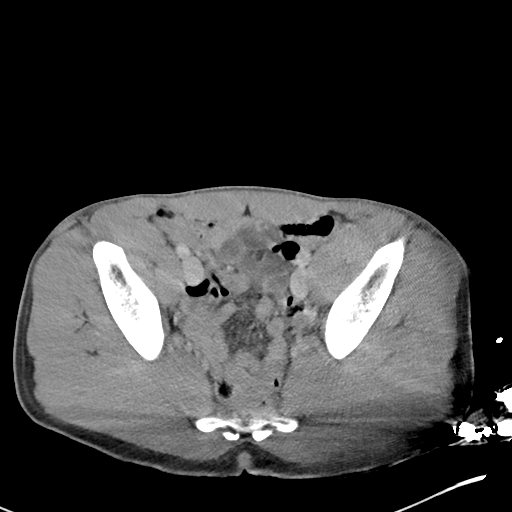
[im 31/86  soft-tissue]
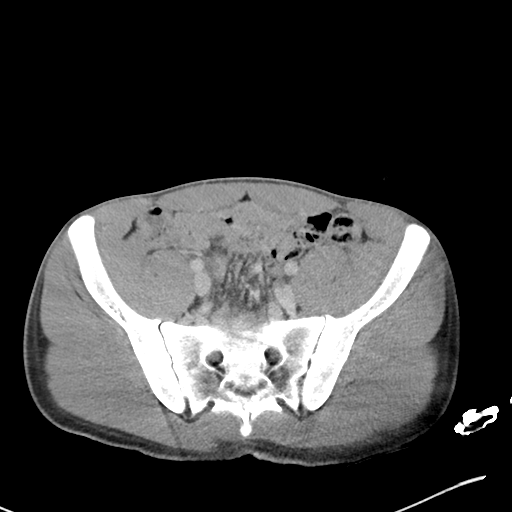
[im 38/86  soft-tissue]
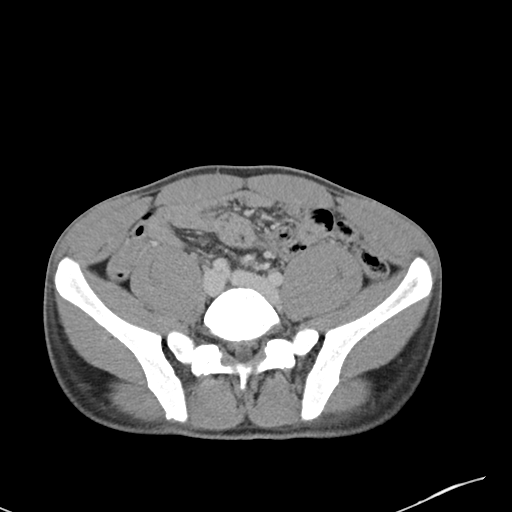
[im 45/86  soft-tissue]
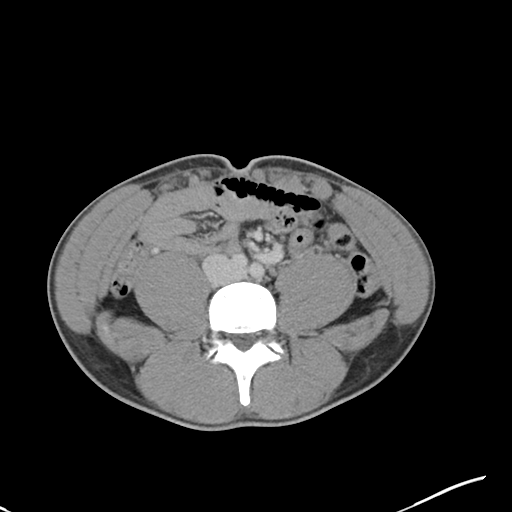
[im 48/86  soft-tissue]
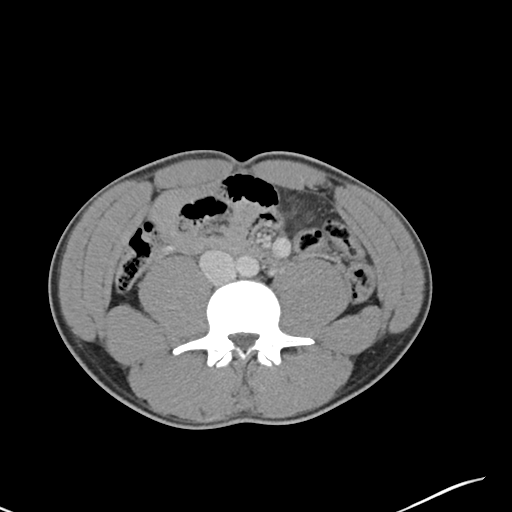
[im 55/86  soft-tissue]
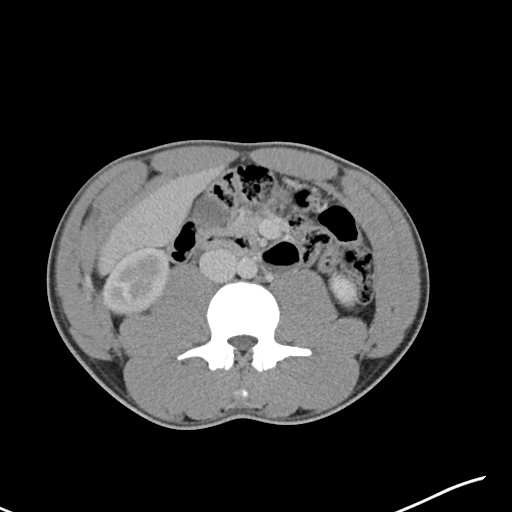
[im 55/86  bone]
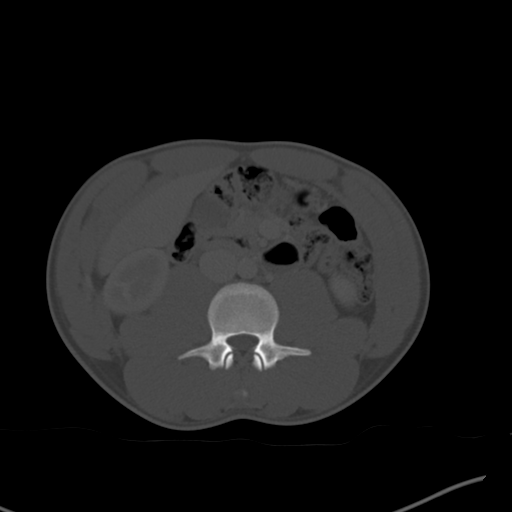
[im 62/86  soft-tissue]
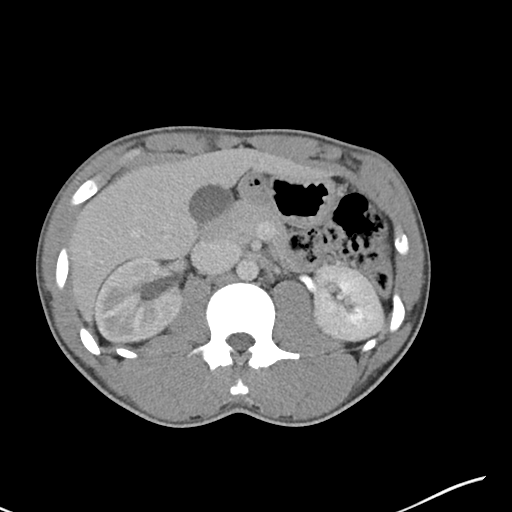
[im 69/86  soft-tissue]
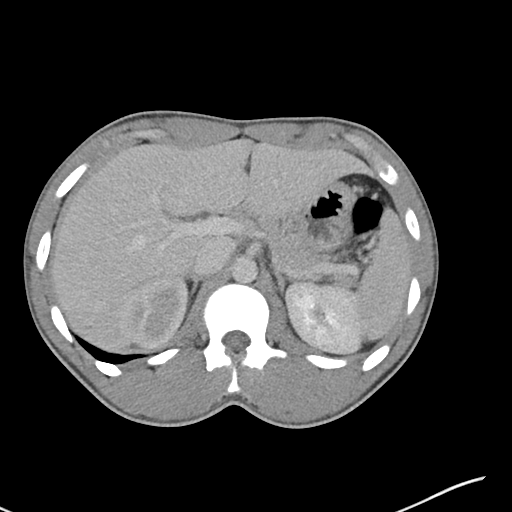
[im 75/86  soft-tissue]
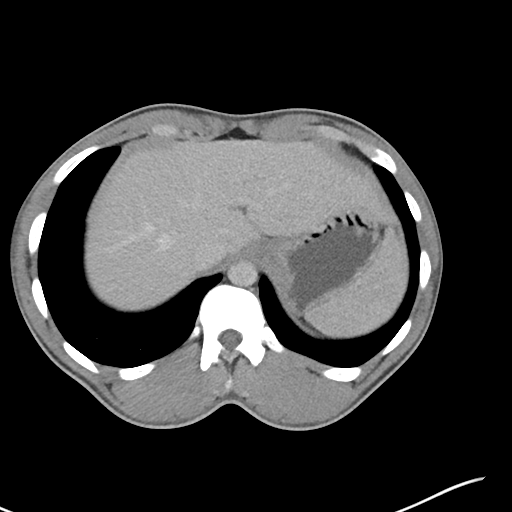
[im 82/86  soft-tissue]
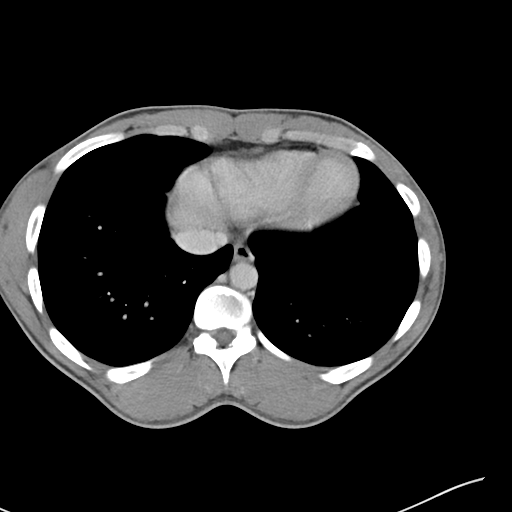

[Series 6: coronal soft tissue · coronal · 0.73mm/px · 3 of 101 slices shown]
[im 34/101  soft-tissue]
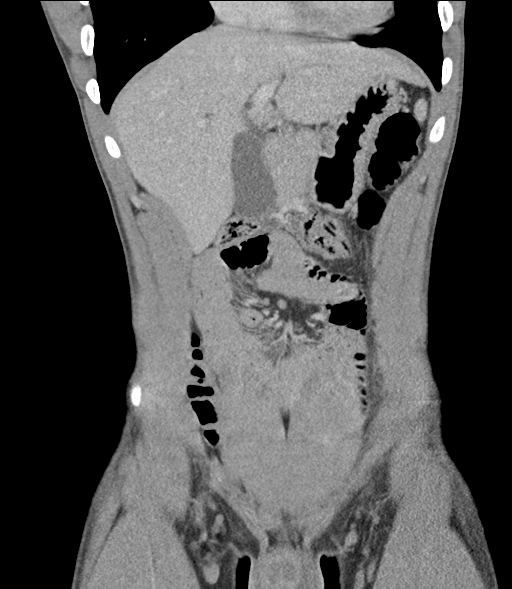
[im 45/101  soft-tissue]
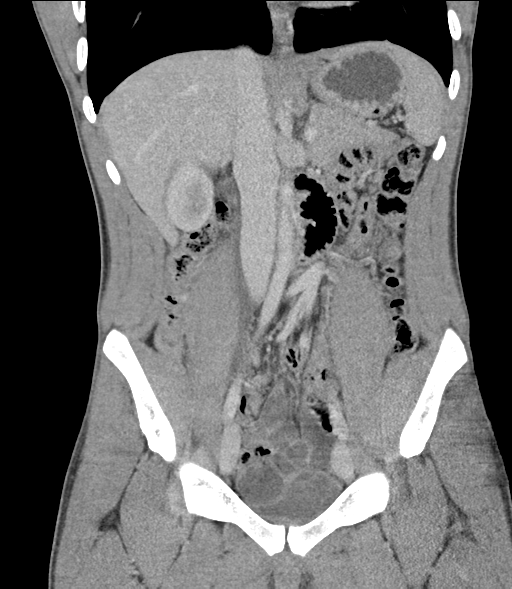
[im 56/101  soft-tissue]
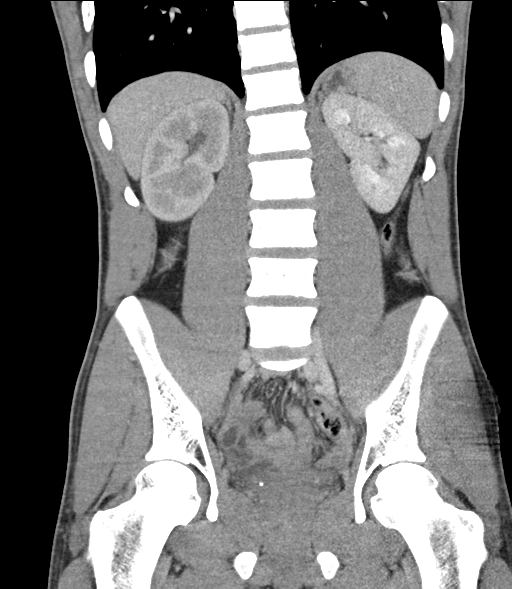

[16 of 46 positions shown; findings below may reference images not displayed]

FINDINGS: Lower chest: No acute abnormality.

Hepatobiliary: No focal liver abnormality. No gallstones,
gallbladder wall thickening, or pericholecystic fluid. No biliary
dilatation.

Pancreas: No focal lesion. Normal pancreatic contour. No surrounding
inflammatory changes. No main pancreatic ductal dilatation.

Spleen: Normal in size without focal abnormality.

Adrenals/Urinary Tract:

No adrenal nodule bilaterally.

Delayed right nephrogram. Prominence of the right collecting system
with no frank hydronephrosis. Question 2 mm stone in the region of
the right ureterovesicular junction ([DATE]). No left hydronephrosis.
No hydroureter.

The urinary bladder is unremarkable.

Stomach/Bowel: Stomach is within normal limits. No evidence of bowel
wall thickening or dilatation. Question internal hernia involving
the proximal small bowel ([DATE]) with no associated bowel
obstruction. Appendix appears normal.

Vascular/Lymphatic: No significant vascular findings are present. No
enlarged abdominal or pelvic lymph nodes.

Reproductive: Prostate is unremarkable.

Other: No intraperitoneal free fluid. No intraperitoneal free gas.
No organized fluid collection.

Musculoskeletal:

No abdominal wall hernia or abnormality.

No suspicious lytic or blastic osseous lesions. No acute displaced
fracture. Multilevel degenerative changes of the spine.
IMPRESSION: 1. Delayed right nephrogram with prominence of the right collecting
system with no frank hydronephrosis. Associated 2 mm stone in the
region of the right ureterovesicular junction.
2. Normal appendix.

## 2023-03-01 ENCOUNTER — Ambulatory Visit: Payer: 59 | Admitting: Nurse Practitioner

## 2023-03-14 ENCOUNTER — Encounter (HOSPITAL_COMMUNITY): Payer: Self-pay | Admitting: *Deleted

## 2023-03-14 ENCOUNTER — Other Ambulatory Visit: Payer: Self-pay

## 2023-03-14 ENCOUNTER — Emergency Department (HOSPITAL_COMMUNITY)
Admission: EM | Admit: 2023-03-14 | Discharge: 2023-03-14 | Disposition: A | Discharge status: Home / Self Care | Payer: 59 | Attending: Emergency Medicine | Admitting: Emergency Medicine

## 2023-03-14 DIAGNOSIS — R55 Syncope and collapse: Secondary | ICD-10-CM | POA: Insufficient documentation

## 2023-03-14 DIAGNOSIS — R42 Dizziness and giddiness: Secondary | ICD-10-CM | POA: Diagnosis not present

## 2023-03-14 HISTORY — DX: Anxiety disorder, unspecified: F41.9

## 2023-03-14 LAB — CBC
HCT: 44.3 % (ref 39.0–52.0)
Hemoglobin: 14.7 g/dL (ref 13.0–17.0)
MCH: 28.7 pg (ref 26.0–34.0)
MCHC: 33.2 g/dL (ref 30.0–36.0)
MCV: 86.5 fL (ref 80.0–100.0)
Platelets: 234 10*3/uL (ref 150–400)
RBC: 5.12 MIL/uL (ref 4.22–5.81)
RDW: 12.6 % (ref 11.5–15.5)
WBC: 3.7 10*3/uL — ABNORMAL LOW (ref 4.0–10.5)
nRBC: 0 % (ref 0.0–0.2)

## 2023-03-14 LAB — URINALYSIS, ROUTINE W REFLEX MICROSCOPIC
Bilirubin Urine: NEGATIVE
Glucose, UA: NEGATIVE mg/dL
Hgb urine dipstick: NEGATIVE
Ketones, ur: NEGATIVE mg/dL
Leukocytes,Ua: NEGATIVE
Nitrite: NEGATIVE
Protein, ur: NEGATIVE mg/dL
Specific Gravity, Urine: 1.023 (ref 1.005–1.030)
pH: 5 (ref 5.0–8.0)

## 2023-03-14 LAB — BASIC METABOLIC PANEL
Anion gap: 7 (ref 5–15)
BUN: 12 mg/dL (ref 6–20)
CO2: 24 mmol/L (ref 22–32)
Calcium: 9.2 mg/dL (ref 8.9–10.3)
Chloride: 105 mmol/L (ref 98–111)
Creatinine, Ser: 1.04 mg/dL (ref 0.61–1.24)
GFR, Estimated: >60 mL/min (ref >60)
Glucose, Bld: 86 mg/dL (ref 70–99)
Potassium: 4 mmol/L (ref 3.5–5.1)
Sodium: 136 mmol/L (ref 135–145)

## 2023-03-14 NOTE — Discharge Instructions (Signed)
The workup in the emergency room that comprised of blood work, evaluation for dehydration, electrolyte abnormality, anemia and arrhythmia is reassuring.  Given that your dizziness on occasion has led to near fainting, we recommend that you follow-up with your primary care doctor.  They might consider additional cardiac workup such as event monitoring or echocardiogram.  Please discuss your findings with the PCP so that they can take the best neck step in your care.  Make sure you remain hydrated. Try to keep a journal of the symptoms and surrounding circumstances if the episodes continue to recur.

## 2023-03-14 NOTE — ED Provider Notes (Signed)
[facility] EMERGENCY DEPARTMENT AT [facility] Provider Note   CSN: [id number] Arrival date & time: 03/14/23  1216     History  Chief Complaint  Patient presents with   Dizziness    Kenneth Farley is a 22 y.o. male.  HPI    22 year old male comes in with chief complaint of dizziness.  Patient accompanied by wife.  Patient indicates that over the last several weeks he has had intermittent episodes of dizziness.  There is no specific evoking, aggravating or relieving factors.  Dizziness can come about even when he is resting sometimes.  He has not appreciated any associated chest pain, palpitations, shortness of breath.  Patient has history of anxiety, but symptoms not consistent with his previous anxiety attacks.  Symptoms would last just for few seconds.  On occasion he has felt near fainting.  Pt has no hx of PE, DVT and denies any exogenous hormone (testosterone / estrogen) use, long distance travels or surgery in the past 6 weeks, active cancer, recent immobilization.  Patient has no known cardiac disease history.  There is no family history of premature CAD or death.  Patient denies any substance use disorder.  Home Medications Prior to Admission medications   Medication Sig Start Date End Date Taking? Authorizing Provider  acetaminophen (TYLENOL) 500 MG tablet Take 2 tablets (1,000 mg total) by mouth every 6 (six) hours as needed. 06/14/22 06/14/23  Adam Phenix, PA-C  FLUoxetine (PROZAC) 10 MG tablet Take 1 tablet (10 mg total) by mouth daily. 07/15/22 08/14/22  Ivonne Andrew, NP  ibuprofen (ADVIL) 200 MG tablet Take 2 tablets (400 mg total) by mouth every 8 (eight) hours as needed for mild pain or moderate pain. 06/14/22   Adam Phenix, PA-C  methocarbamol 1000 MG TABS Take 1,000 mg by mouth every 6 (six) hours as needed for muscle spasms. 06/14/22   Adam Phenix, PA-C  oxyCODONE 10 MG TABS Take 1 tablet (10 mg total) by mouth every 6 (six) hours as  needed for moderate pain, severe pain or breakthrough pain (pain not releived by tylenol, ibuprofen, robaxin). 06/14/22   Adam Phenix, PA-C  polyethylene glycol (MIRALAX / GLYCOLAX) 17 g packet Take 17 g by mouth daily as needed for mild constipation. 06/14/22   Adam Phenix, PA-C  tiZANidine (ZANAFLEX) 4 MG tablet Take 1 tablet (4 mg total) by mouth every 6 (six) hours as needed for muscle spasms. 07/15/22   Ivonne Andrew, NP      Allergies    Patient has no known allergies.    Review of Systems   Review of Systems  All other systems reviewed and are negative.   Physical Exam Updated Vital Signs BP 126/81 (BP Location: Right Arm)   Pulse 64   Temp 98.3 F (36.8 C) (Oral)   Resp 18   Ht 6' (1.829 m)   Wt 72.6 kg   SpO2 100%   BMI 21.70 kg/m  Physical Exam Vitals and nursing note reviewed.  Constitutional:      Appearance: He is well-developed.  HENT:     Head: Atraumatic.  Cardiovascular:     Rate and Rhythm: Normal rate.     Heart sounds: No murmur heard. Pulmonary:     Effort: Pulmonary effort is normal.  Musculoskeletal:     Cervical back: Normal range of motion and neck supple.  Skin:    General: Skin is warm.  Neurological:     Mental Status: He  is alert and oriented to person, place, and time.     ED Results / Procedures / Treatments   Labs (all labs ordered are listed, but only abnormal results are displayed) Labs Reviewed  CBC - Abnormal; Notable for the following components:      Result Value   WBC 3.7 (*)    All other components within normal limits  BASIC METABOLIC PANEL  URINALYSIS, ROUTINE W REFLEX MICROSCOPIC    EKG EKG Interpretation  Date/Time:  Sunday Mar 14 2023 12:50:39 EDT Ventricular Rate:  72 PR Interval:  150 QRS Duration: 94 QT Interval:  374 QTC Calculation: 409 R Axis:   100 Text Interpretation: Normal sinus rhythm Rightward axis Borderline ECG When compared with ECG of 07-Jun-2022 13:18, PREVIOUS ECG IS PRESENT  No acute changes No significant change since last tracing Confirmed by Derwood Kaplan [phone]) on 03/14/2023 3:31:05 PM  Radiology No results found.  Procedures Procedures    Medications Ordered in ED Medications - No data to display  ED Course/ Medical Decision Making/ A&P                             Medical Decision Making Amount and/or Complexity of Data Reviewed Labs: ordered.   This patient presents to the ED with chief complaint(s) of dizziness with with no pertinent past medical history.  Occasionally patient has had near fainting. Patient's medical history, social history and family history are reassuring.  The differential diagnosis includes : Orthostatic dizziness, POTS, dehydration, transient hypoglycemia, arrhythmia.  In the emergency room, patient received basic blood workup, EKG and they are reassuring.  He has not had any dizziness here in the emergency room.   Additional history obtained: Additional history obtained from significant other  Independent labs interpretation:  The following labs were independently interpreted: CBC is showing no anemia.  BMP, UA show no profound dehydration.   Treatment and Reassessment: Patient is EKG is also reassuring. Advised outpatient PCP follow-up for more comprehensive evaluation.  Because of near fainting, I advised that he discuss with the PCP if there is need for event monitor or echocardiogram.  Final Clinical Impression(s) / ED Diagnoses Final diagnoses:  Dizziness  Near syncope    Rx / DC Orders ED Discharge Orders     None         Derwood Kaplan, MD 03/14/23 1554

## 2023-03-14 NOTE — ED Triage Notes (Signed)
PT c/o feeling lightheaded intermittently for several weeks.  States hx of anxiety, not sure if it is the same.  No increase in symptoms, would just like to get it checked out.  Denies pain.

## 2023-03-15 ENCOUNTER — Ambulatory Visit (INDEPENDENT_AMBULATORY_CARE_PROVIDER_SITE_OTHER): Payer: 59 | Admitting: Nurse Practitioner

## 2023-03-15 VITALS — BP 116/70 | HR 60 | Temp 97.9°F | Wt 169.6 lb

## 2023-03-15 DIAGNOSIS — Z113 Encounter for screening for infections with a predominantly sexual mode of transmission: Secondary | ICD-10-CM | POA: Diagnosis not present

## 2023-03-15 DIAGNOSIS — F419 Anxiety disorder, unspecified: Secondary | ICD-10-CM

## 2023-03-15 DIAGNOSIS — R55 Syncope and collapse: Secondary | ICD-10-CM

## 2023-03-15 DIAGNOSIS — R42 Dizziness and giddiness: Secondary | ICD-10-CM | POA: Diagnosis not present

## 2023-03-15 MED ORDER — FLUOXETINE HCL 10 MG PO TABS: 10.0000 mg | ORAL_TABLET | Freq: Every day | ORAL | 0 refills | Status: DC

## 2023-03-15 MED ORDER — MECLIZINE HCL 12.5 MG PO TABS: 12.5000 mg | ORAL_TABLET | Freq: Three times a day (TID) | ORAL | 0 refills | Status: AC | PRN

## 2023-03-15 NOTE — Assessment & Plan Note (Signed)
-   Chlamydia/Gonococcus/Trichomonas, NAA - RPR+HIV+GC+CT Panel  2. Postural dizziness with presyncope  - TSH - Iron, TIBC and Ferritin Panel - meclizine (ANTIVERT) 12.5 MG tablet; Take 1 tablet (12.5 mg total) by mouth 3 (three) times daily as needed for dizziness.  Dispense: 30 tablet; Refill: 0 - Ambulatory referral to Cardiology  -Eat six snacks per day high protein / low carb  -stay well hydrated   3. Anxiety  - FLUoxetine (PROZAC) 10 MG tablet; Take 1 tablet (10 mg total) by mouth daily.  Dispense: 30 tablet; Refill: 0

## 2023-03-15 NOTE — Progress Notes (Signed)
@Patient  ID: Kenneth Farley, male    DOB: 2001/09/13, 22 y.o.   MRN: [id number]  Chief Complaint  Patient presents with   Consult    Requesting blood work    Dizziness    No reason just happens . Pt also advised when he gets anxiety he will feel dizzy    Referring provider: Ivonne Andrew, NP   HPI  22 year old male with history of appendectomy  Patient presents today for ED follow-up.  Patient was seen in the ED yesterday.  He states that he has been having dizzy spells for the past 2 months.  He states this happens intermittently.  He states that he does feel like he is going to pass out at times.  Patient states that he is not having any dizziness today. Labs and EKG were reassuring in the ED.  We will trial meclizine.  Patient did note that he has not ate anything all day today.  He does skip meals at times.  We discussed that it is important for him to eat 6 small meals/snacks daily that are high-protein low-carb.  This will help keep his blood sugar stable.  We will check thyroid level today.  We will check iron panel today.  We will refer patient to cardiology concern for transient hypoglycemia versus orthostatic dizziness versus POTS.  Patient may need event monitor and echo for further evaluation.  Also discussed that patient does need to stay well-hydrated as well.    No Known Allergies   There is no immunization history on file for this patient.  Past Medical History:  Diagnosis Date   Anxiety    Medical history non-contributory     Tobacco History: Social History   Tobacco Use  Smoking Status Never  Smokeless Tobacco Never   Counseling given: Not Answered   Outpatient Encounter Medications as of 03/15/2023  Medication Sig   acetaminophen (TYLENOL) 500 MG tablet Take 2 tablets (1,000 mg total) by mouth every 6 (six) hours as needed.   ibuprofen (ADVIL) 200 MG tablet Take 2 tablets (400 mg total) by mouth every 8 (eight) hours as needed for mild pain or  moderate pain.   meclizine (ANTIVERT) 12.5 MG tablet Take 1 tablet (12.5 mg total) by mouth 3 (three) times daily as needed for dizziness.   methocarbamol 1000 MG TABS Take 1,000 mg by mouth every 6 (six) hours as needed for muscle spasms.   FLUoxetine (PROZAC) 10 MG tablet Take 1 tablet (10 mg total) by mouth daily.   oxyCODONE 10 MG TABS Take 1 tablet (10 mg total) by mouth every 6 (six) hours as needed for moderate pain, severe pain or breakthrough pain (pain not releived by tylenol, ibuprofen, robaxin). (Patient not taking: Reported on 03/15/2023)   polyethylene glycol (MIRALAX / GLYCOLAX) 17 g packet Take 17 g by mouth daily as needed for mild constipation. (Patient not taking: Reported on 03/15/2023)   tiZANidine (ZANAFLEX) 4 MG tablet Take 1 tablet (4 mg total) by mouth every 6 (six) hours as needed for muscle spasms. (Patient not taking: Reported on 03/15/2023)   [DISCONTINUED] FLUoxetine (PROZAC) 10 MG tablet Take 1 tablet (10 mg total) by mouth daily. (Patient not taking: Reported on 03/15/2023)   No facility-administered encounter medications on file as of 03/15/2023.     Review of Systems  Review of Systems  Constitutional: Negative.   HENT: Negative.    Cardiovascular: Negative.   Gastrointestinal: Negative.   Allergic/Immunologic: Negative.   Neurological: Negative.  Psychiatric/Behavioral: Negative.         Physical Exam  BP 116/70   Pulse 60   Temp 97.9 F (36.6 C)   Wt 169 lb 9.6 oz (76.9 kg)   SpO2 100%   BMI 23.00 kg/m   Wt Readings from Last 5 Encounters:  03/15/23 169 lb 9.6 oz (76.9 kg)  03/14/23 160 lb (72.6 kg)  08/28/22 164 lb 9.6 oz (74.7 kg)  07/15/22 156 lb 9.6 oz (71 kg)  06/10/22 173 lb 1 oz (78.5 kg)     Physical Exam Vitals and nursing note reviewed.  Constitutional:      General: He is not in acute distress.    Appearance: He is well-developed.  Cardiovascular:     Rate and Rhythm: Normal rate and regular rhythm.  Pulmonary:     Effort:  Pulmonary effort is normal.     Breath sounds: Normal breath sounds.  Skin:    General: Skin is warm and dry.  Neurological:     Mental Status: He is alert and oriented to person, place, and time.      Assessment & Plan:   Screen for STD (sexually transmitted disease) - Chlamydia/Gonococcus/Trichomonas, NAA - RPR+HIV+GC+CT Panel  2. Postural dizziness with presyncope  - TSH - Iron, TIBC and Ferritin Panel - meclizine (ANTIVERT) 12.5 MG tablet; Take 1 tablet (12.5 mg total) by mouth 3 (three) times daily as needed for dizziness.  Dispense: 30 tablet; Refill: 0 - Ambulatory referral to Cardiology  -Eat six snacks per day high protein / low carb  -stay well hydrated   3. Anxiety  - FLUoxetine (PROZAC) 10 MG tablet; Take 1 tablet (10 mg total) by mouth daily.  Dispense: 30 tablet; Refill: 0     Ivonne Andrew, NP 03/15/2023

## 2023-03-15 NOTE — Patient Instructions (Signed)
1. Screen for STD (sexually transmitted disease)  - Chlamydia/Gonococcus/Trichomonas, NAA - RPR+HIV+GC+CT Panel  2. Postural dizziness with presyncope  - TSH - Iron, TIBC and Ferritin Panel - meclizine (ANTIVERT) 12.5 MG tablet; Take 1 tablet (12.5 mg total) by mouth 3 (three) times daily as needed for dizziness.  Dispense: 30 tablet; Refill: 0 - Ambulatory referral to Cardiology  -Eat six snacks per day high protein / low carb  -stay well hydrated   3. Anxiety  - FLUoxetine (PROZAC) 10 MG tablet; Take 1 tablet (10 mg total) by mouth daily.  Dispense: 30 tablet; Refill: 0

## 2023-03-17 LAB — TSH: TSH: 1.25 u[IU]/mL (ref 0.450–4.500)

## 2023-03-17 LAB — CHLAMYDIA/GONOCOCCUS/TRICHOMONAS, NAA
Chlamydia by NAA: NEGATIVE
Gonococcus by NAA: NEGATIVE
Trich vag by NAA: NEGATIVE

## 2023-03-17 LAB — IRON,TIBC AND FERRITIN PANEL
Ferritin: 52 ng/mL (ref 30–400)
Iron Saturation: 18 % (ref 15–55)
Iron: 61 ug/dL (ref 38–169)
Total Iron Binding Capacity: 337 ug/dL (ref 250–450)
UIBC: 276 ug/dL (ref 111–343)

## 2023-03-17 LAB — RPR+HIV+GC+CT PANEL
Chlamydia trachomatis, NAA: NEGATIVE
HIV Screen 4th Generation wRfx: NONREACTIVE
Neisseria Gonorrhoeae by PCR: NEGATIVE
RPR Ser Ql: NONREACTIVE

## 2023-03-17 NOTE — Progress Notes (Signed)
Pt has seen on my chart. Gh

## 2023-04-14 ENCOUNTER — Other Ambulatory Visit: Payer: Self-pay | Admitting: Nurse Practitioner

## 2023-04-14 DIAGNOSIS — F419 Anxiety disorder, unspecified: Secondary | ICD-10-CM

## 2023-04-14 NOTE — Telephone Encounter (Signed)
Please advise Kh 

## 2023-04-23 ENCOUNTER — Ambulatory Visit: Payer: 59 | Attending: Cardiovascular Disease | Admitting: Cardiovascular Disease

## 2023-04-23 NOTE — Progress Notes (Deleted)
Cardiology Office Note:   Date:  04/23/2023  NAME:  Kenneth Farley    MRN: [id number] DOB:  20-Mar-2001   PCP:  Ivonne Andrew, NP  Cardiologist:  None  Electrophysiologist:  None   Referring MD: Ivonne Andrew, NP   No chief complaint on file. ***  History of Present Illness:   Kenneth Farley is a 22 y.o. male with a hx of anxiety who is being seen today for the evaluation of postural dizziness at the request of Ivonne Andrew, NP.  Past Medical History: Past Medical History:  Diagnosis Date   Anxiety    Medical history non-contributory     Past Surgical History: Past Surgical History:  Procedure Laterality Date   APPENDECTOMY N/A 06/08/2022   Procedure: APPENDECTOMY;  Surgeon: Violeta Gelinas, MD;  Location: [facility] OR;  Service: General;  Laterality: N/A;   APPLICATION OF WOUND VAC  06/07/2022   Procedure: APPLICATION OF WOUND VAC;  Surgeon: Sheliah Hatch De Blanch, MD;  Location: [facility] OR;  Service: General;;   LAPAROSCOPY N/A 06/07/2022   Procedure: EXPLORATORY LAPAROTOMY;  Surgeon: Rodman Pickle, MD;  Location: [facility] OR;  Service: General;  Laterality: N/A;   LAPAROTOMY N/A 06/08/2022   Procedure: RE-EXPLORATORY LAPAROTOMY, REMOVAL OF HERNIA SAC, ABDOMINAL CLOSURE;  Surgeon: Violeta Gelinas, MD;  Location: [facility] OR;  Service: General;  Laterality: N/A;    Current Medications: No outpatient medications have been marked as taking for the 04/23/23 encounter (Appointment) with O'Neal, Ronnald Ramp, MD.     Allergies:    Patient has no known allergies.   Social History: Social History   Socioeconomic History   Marital status: Single    Spouse name: Not on file   Number of children: Not on file   Years of education: Not on file   Highest education level: Not on file  Occupational History   Not on file  Tobacco Use   Smoking status: Never   Smokeless tobacco: Never  Vaping Use   Vaping Use: Never used  Substance and Sexual Activity   Alcohol use: No   Drug use:  No   Sexual activity: Not on file  Other Topics Concern   Not on file  Social History Narrative   Not on file   Social Determinants of Health   Financial Resource Strain: Not on file  Food Insecurity: Not on file  Transportation Needs: Not on file  Physical Activity: Not on file  Stress: Not on file  Social Connections: Not on file     Family History: The patient's ***family history includes Anxiety disorder in his mother; Hypertension in his maternal grandmother and mother.  ROS:   All other ROS reviewed and negative. Pertinent positives noted in the HPI.     EKGs/Labs/Other Studies Reviewed:   The following studies were personally reviewed by me today:  EKG:  EKG is *** ordered today.  The ekg ordered today demonstrates ***, and was personally reviewed by me.   Recent Labs: 06/08/2022: Magnesium 2.0 07/15/2022: ALT 21 03/14/2023: BUN 12; Creatinine, Ser 1.04; Hemoglobin 14.7; Platelets 234; Potassium 4.0; Sodium 136 03/15/2023: TSH 1.250   Recent Lipid Panel    Component Value Date/Time   CHOL 136 07/15/2022 1606   TRIG 60 07/15/2022 1606   HDL 44 07/15/2022 1606   CHOLHDL 3.1 07/15/2022 1606   LDLCALC 79 07/15/2022 1606    Physical Exam:   VS:  There were no vitals taken for this visit.   Wt Readings from Last 3  Encounters:  03/15/23 169 lb 9.6 oz (76.9 kg)  03/14/23 160 lb (72.6 kg)  08/28/22 164 lb 9.6 oz (74.7 kg)    General: Well nourished, well developed, in no acute distress Head: Atraumatic, normal size  Eyes: PEERLA, EOMI  Neck: Supple, no JVD Endocrine: No thryomegaly Cardiac: Normal S1, S2; RRR; no murmurs, rubs, or gallops Lungs: Clear to auscultation bilaterally, no wheezing, rhonchi or rales  Abd: Soft, nontender, no hepatomegaly  Ext: No edema, pulses 2+ Musculoskeletal: No deformities, BUE and BLE strength normal and equal Skin: Warm and dry, no rashes   Neuro: Alert and oriented to person, place, time, and situation, CNII-XII grossly intact,  no focal deficits  Psych: Normal mood and affect   ASSESSMENT:   Kenneth Farley is a 22 y.o. male who presents for the following: No diagnosis found.  PLAN:   There are no diagnoses linked to this encounter.  {Are you ordering a CV Procedure (e.g. stress test, cath, DCCV, TEE, etc)?   Press F2        :[id number]}  Disposition: No follow-ups on file.  Medication Adjustments/Labs and Tests Ordered: Current medicines are reviewed at length with the patient today.  Concerns regarding medicines are outlined above.  No orders of the defined types were placed in this encounter.  No orders of the defined types were placed in this encounter.   There are no Patient Instructions on file for this visit.   Time Spent with Patient: I have spent a total of *** minutes with patient reviewing hospital notes, telemetry, EKGs, labs and examining the patient as well as establishing an assessment and plan that was discussed with the patient.  > 50% of time was spent in direct patient care.  Signed, Lenna Gilford. Flora Lipps, MD, [facility]  [facility]  [address] [place], [place] [address] [phone]  04/23/2023 7:58 AM

## 2024-08-10 ENCOUNTER — Ambulatory Visit: Admitting: Nurse Practitioner
